# Patient Record
Sex: Female | Born: 1990 | Race: Black or African American | Hispanic: No | Marital: Single | Smoking: Current every day smoker
Health system: Southern US, Community
[De-identification: ages and names within clinical notes are randomized; demographics above are authoritative.]

## PROBLEM LIST (undated history)

## (undated) ENCOUNTER — Inpatient Hospital Stay (HOSPITAL_COMMUNITY): Payer: Self-pay

## (undated) DIAGNOSIS — G43909 Migraine, unspecified, not intractable, without status migrainosus: Secondary | ICD-10-CM

## (undated) DIAGNOSIS — A6000 Herpesviral infection of urogenital system, unspecified: Secondary | ICD-10-CM

## (undated) DIAGNOSIS — Z789 Other specified health status: Secondary | ICD-10-CM

## (undated) DIAGNOSIS — A549 Gonococcal infection, unspecified: Secondary | ICD-10-CM

## (undated) DIAGNOSIS — A749 Chlamydial infection, unspecified: Secondary | ICD-10-CM

## (undated) HISTORY — PX: NO PAST SURGERIES: SHX2092

---
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
## 2011-01-05 LAB — RAPID URINE DRUG SCREEN, HOSP PERFORMED: Tetrahydrocannabinol: NOT DETECTED

## 2011-04-10 NOTE — L&D Delivery Note (Signed)
Delivery Note At 7:00 PM a viable female was delivered via Vaginal, Spontaneous Delivery (Presentation: ;  ).  APGAR: , ; weight .   Placenta status: , .  Cord:  with the following complications: .  Cord pH: not done  Anesthesia: Epidural  Episiotomy:  Lacerations:  Suture Repair: 2.0 vicryl Est. Blood Loss (mL):   Mom to postpartum.  Baby to nursery-stable.  Shady Padron A 03/27/2012, 7:10 PM

## 2011-08-29 ENCOUNTER — Encounter (HOSPITAL_COMMUNITY): Payer: Self-pay

## 2011-08-29 ENCOUNTER — Emergency Department (INDEPENDENT_AMBULATORY_CARE_PROVIDER_SITE_OTHER)
Admission: EM | Admit: 2011-08-29 | Discharge: 2011-08-29 | Disposition: A | Discharge status: Home / Self Care | Payer: Medicaid Other | Source: Home / Self Care | Attending: Emergency Medicine | Admitting: Emergency Medicine

## 2011-08-29 DIAGNOSIS — Z3201 Encounter for pregnancy test, result positive: Secondary | ICD-10-CM

## 2011-08-29 LAB — POCT PREGNANCY, URINE: Preg Test, Ur: POSITIVE — AB

## 2011-08-29 MED ORDER — PRENATAL MULTIVIT-IRON PO TABS: 1.0000 | ORAL_TABLET | ORAL | Status: DC

## 2011-08-29 MED ORDER — PRENATAL VITAMINS (DIS) PO TABS: 1.0000 | ORAL_TABLET | ORAL | Status: DC

## 2011-08-29 MED ORDER — PRENATAL VITAMINS PLUS 27-1 MG PO TABS: 1.0000 | ORAL_TABLET | ORAL | Status: DC

## 2011-08-29 NOTE — ED Notes (Signed)
States she has missed 2 periods, having episodic lower abdominal area pain, none at present; here for pregnancy test only; denies STD concerns

## 2011-08-29 NOTE — Discharge Instructions (Signed)
Prenatal Care  WHAT IS PRENATAL CARE?  Prenatal care means health care during your pregnancy, before your baby is born. Take care of yourself and your baby by:   Getting early prenatal care. If you know you are pregnant, or think you might be pregnant, call your caregiver as soon as possible. Schedule a visit for a general/prenatal examination.   Getting regular prenatal care. Follow your caregiver's schedule for blood and other necessary tests. Do not miss appointments.   Do everything you can to keep yourself and your baby healthy during your pregnancy.   Prenatal care should include evaluation of medical, dietary, educational, psychological, and social needs for the couple and the medical, surgical, and genetic history of the family of the mother and father.   Discuss with your caregiver:   Your medicines, prescription, over-the-counter, and herbal medicines.   Substance abuse, alcohol, smoking, and illegal drugs.   Domestic abuse and violence, if present.   Your immunizations.   Nutrition and diet.   Exercising.   Environment and occupational hazards, at home and at work.   History of sexually transmitted disease, both you and your partner.   Previous pregnancies.  WHY IS PRENATAL CARE SO IMPORTANT?  By seeing you regularly, your caregiver has the chance to find problems early, so that they can be treated as soon as possible. Other problems might be prevented. Many studies have shown that early and regular prenatal care is important for the health of both mothers and their babies.  I AM THINKING ABOUT GETTING PREGNANT. HOW CAN I TAKE CARE OF MYSELF?  Taking care of yourself before you get pregnant helps you to have a healthy pregnancy. It also lowers your chances of having a baby born with a birth defect. Here are ways to take care of yourself before you get pregnant:   Eat healthy foods, exercise regularly (30 minutes per day for most days of the week is best), and get enough  rest and sleep. Talk to your caregiver about what kinds of foods and exercises are best for you.   Take 400 micrograms (mcg) of folic acid (one of the B vitamins) every day. The best way to do this is to take a daily multivitamin pill that contains this amount of folic acid. Getting enough of the synthetic (manufactured) form of folic acid every day before you get pregnant and during early pregnancy can help prevent certain birth defects. Many breakfast cereals and other grain products have folic acid added to them, but only certain cereals contain 400 mcg of folic acid per serving. Check the label on your multivitamin or cereal to find the amount of folic acid in the food.   See your caregiver for a complete check up before getting pregnant. Make sure that you have had all your immunization shots, especially for rubella (German measles). Rubella can cause serious birth defects. Chickenpox is another illness you want to avoid during pregnancy. If you have had chickenpox and rubella in the past, you should be immune to them.   Tell your caregiver about any prescription or non-prescription medicines (including herbal remedies) you are taking. Some medicines are not safe to take during pregnancy.   Stop smoking cigarettes, drinking alcohol, or taking illegal drugs. Ask your caregiver for help, if you need it. You can also get help with alcohol and drugs by talking with a member of your faith community, a counselor, or a trusted friend.   Discuss and treat any medical, social, or psychological   problems before getting pregnant.   Discuss any history of genetic problems in the mother, father, and their families. Do genetic testing before getting pregnant, when possible.   Discuss any physical or emotional abuse with your caregiver.   Discuss with your caregiver if you might be exposed to harmful chemicals on your job or where you live.   Discuss with your caregiver if you think your job or the hours you  work may be harmful and should be changed.   The father should be involved with the decision making and with all aspects of the pregnancy, labor, and delivery.   If you have medical insurance, make sure you are covered for pregnancy.  I JUST FOUND OUT THAT I AM PREGNANT. HOW CAN I TAKE CARE OF MYSELF?  Here are ways to take care of yourself and the precious new life growing inside you:   Continue taking your multivitamin with 400 micrograms (mcg) of folic acid every day.   Get early and regular prenatal care. It does not matter if this is your first pregnancy or if you already have children. It is very important to see a caregiver during your pregnancy. Your caregiver will check at each visit to make sure that you and the baby are healthy. If there are any problems, action can be taken right away to help you and the baby.   Eat a healthy diet that includes:   Fruits.   Vegetables.   Foods low in saturated fat.   Grains.   Calcium-rich foods.   Drink 6 to 8 glasses of liquids a day.   Unless your caregiver tells you not to, try to be physically active for 30 minutes, most days of the week. If you are pressed for time, you can get your activity in through 10 minute segments, three times a day.   If you smoke, drink alcohol, or use drugs, STOP. These can cause long-term damage to your baby. Talk with your caregiver about steps to take to stop smoking. Talk with a member of your faith community, a counselor, a trusted friend, or your caregiver if you are concerned about your alcohol or drug use.   Ask your caregiver before taking any medicine, even over-the-counter medicines. Some medicines are not safe to take during pregnancy.   Get plenty of rest and sleep.   Avoid hot tubs and saunas during pregnancy.   Do not have X-rays taken, unless absolutely necessary and with the recommendation of your caregiver. A lead shield can be placed on your abdomen, to protect the baby when X-rays  are taken in other parts of the body.   Do not empty the cat litter when you are pregnant. It may contain a parasite that causes an infection called toxoplasmosis, which can cause birth defects. Also, use gloves when working in garden areas used by cats.   Do not eat uncooked or undercooked cheese, meats, or fish.   Stay away from toxic chemicals like:   Insecticides.   Solvents (some cleaners or paint thinners).   Lead.   Mercury.   Sexual relations may continue until the end of the pregnancy, unless you have a medical problem or there is a problem with the pregnancy and your caregiver tells you not to.   Do not wear high heel shoes, especially during the second half of the pregnancy. You can lose your balance and fall.   Do not take long trips, unless absolutely necessary. Be sure to see your caregiver before   going on the trip.   Do not sit in one position for more than 2 hours, when on a trip.   Take a copy of your medical records when going on a trip.   Know where there is a hospital in the city you are visiting, in case of an emergency.   Most dangerous household products will have pregnancy warnings on their labels. Ask your caregiver about products if you are unsure.   Limit or eliminate your caffeine intake from coffee, tea, sodas, medicines, and chocolate.   Many women continue working through pregnancy. Staying active might help you stay healthier. If you have a question about the safety or the hours you work at your particular job, talk with your caregiver.   Get informed:   Read books.   Watch videos.   Go to childbirth classes for you and the father.   Talk with experienced moms.   Ask your caregiver about childbirth education classes for you and your partner. Classes can help you and your partner prepare for the birth of your baby.   Ask about a pediatrician (baby doctor) and methods and pain medicine for labor, delivery, and possible Cesarean delivery  (C-section).  I AM NOT THINKING ABOUT GETTING PREGNANT RIGHT NOW, BUT HEARD THAT ALL WOMEN SHOULD TAKE FOLIC ACID EVERY DAY?  All women of childbearing age, with even a remote chance of getting pregnant, should try to make sure they get enough folic acid. Many pregnancies are not planned. Many women do not know they are actually pregnant early in their pregnancies, and certain birth defects happen in the very early part of pregnancy. Taking 400 micrograms (mcg) of folic acid every day will help prevent certain birth defects that happen in the early part of pregnancy. If a woman begins taking vitamin pills in the second or third month of pregnancy, it may be too late to prevent birth defects. Folic acid may also have other health benefits for women, besides preventing birth defects.  HOW OFTEN SHOULD I SEE MY CAREGIVER DURING PREGNANCY?  Your caregiver will give you a schedule for your prenatal visits. You will have visits more often as you get closer to the end of your pregnancy. An average pregnancy lasts about 40 weeks.  A typical schedule includes visiting your caregiver:   About once each month, during your first 6 months of pregnancy.   Every 2 weeks, during the next 2 months.   Weekly in the last month, until the delivery date.  Your caregiver will probably want to see you more often if:  You are over 35.   Your pregnancy is high risk, because you have certain health problems or problems with the pregnancy, such as:   Diabetes.   High blood pressure.   The baby is not growing on schedule, according to the dates of the pregnancy.  Your caregiver will do special tests, to make sure you and the baby are not having any serious problems. WHAT HAPPENS DURING PRENATAL VISITS?   At your first prenatal visit, your caregiver will talk to you about you and your partner's health history and your family's health history, and will do a physical exam.   On your first visit, a physical exam will  include checks of your blood pressure, height and weight, and an exam of your pelvic organs. Your caregiver will do a Pap test if you have not had one recently, and will do cultures of your cervix to make sure there is no   infection.   At each visit, there will be tests of your blood, urine, blood pressure, weight, and checking the progress of the baby.   Your caregiver will be able to tell you when to expect that your baby will be born.   Each visit is also a chance for you to learn about staying healthy during pregnancy and for asking questions.   Discuss whether you will be breastfeeding.   At your later prenatal visits, your caregiver will check how you are doing and how the baby is developing. You may have a number of tests done as your pregnancy progresses.   Ultrasound exams are often used to check on the baby's growth and health.   You may have more urine and blood tests, as well as special tests, if needed. These may include amniocentesis (examine fluid in the pregnancy sac), stress tests (check how baby responds to contractions), biophysical profile (measures fetus well-being). Your caregiver will explain the tests and why they are necessary.  I AM IN MY LATE THIRTIES, AND I WANT TO HAVE A CHILD NOW. SHOULD I DO ANYTHING SPECIAL?  As you get older, there is more chance of having a medical problem (high blood pressure), pregnancy problem (preeclampsia, problems with the placenta), miscarriage, or a baby born with a birth defect. However, most women in their late thirties and early forties have healthy babies. See your caregiver on a regular basis before you get pregnant and be sure to go for exams throughout your pregnancy. Your caregiver probably will want to do some special tests to check on you and your baby's health when you are pregnant.  Women today are often delaying having children until later in life, when they are in their thirties and forties. While many women in their thirties  and forties have no difficulty getting pregnant, fertility does decline with age. For women over 40 who cannot get pregnant after 6 months of trying, it is recommended that they see their caregiver for a fertility evaluation. It is not uncommon to have trouble becoming pregnant or experience infertility (inability to become pregnant after trying for one year). If you think that you or your partner may be infertile, you can discuss this with your caregiver. He or she can recommend treatments such as drugs, surgery, or assisted reproductive technology.  Document Released: 03/29/2003 Document Revised: 03/15/2011 Document Reviewed: 02/23/2009 [facility] Patient Information 2012 [place], [place].Pregnancy Tests HOW DO PREGNANCY TESTS WORK? All pregnancy tests look for a special hormone in the urine or blood that is only present in pregnant women. This hormone, human chorionic gonadotropin (hCG), is also called the pregnancy hormone.  WHAT IS THE DIFFERENCE BETWEEN A URINE AND A BLOOD PREGNANCY TEST? IS ONE BETTER THAN THE OTHER? There are two types of pregnancy tests.  Blood tests.   Urine tests.  Both tests look for the presence of hCG, the pregnancy hormone. Many women use a urine test or home pregnancy test (HPT) to find out if they are pregnant. HPTs are cheap, easy to use, can be done at home, and are private. When a woman has a positive result on an HPT, she needs to see her caregiver right away. The caregiver can confirm a positive HPT result with another urine test, a blood test, ultrasound, and a pelvic exam.  There are two types of blood tests you can get from a caregiver.   A quantitative blood test (or the beta hCG test). This test measures the exact amount of hCG in  the blood. This means it can pick up very small amounts of hCG, making it a very accurate test.   A qualitative hCG blood test. This test gives a simple yes or no answer to whether you are pregnant. This test is more like a urine  test in terms of its accuracy.  Blood tests can pick up hCG earlier in a pregnancy than urine tests can. Blood tests can tell if you are pregnant about 6 to 8 days after you release an egg from an ovary (ovulate). Urine tests can determine pregnancy about 2 weeks after ovulation. Some more sensitive urine tests can tell if you are pregnant as early as 6 days or even 1 day after you miss a menstrual period.  HOW IS A HOME PREGNANCY TEST DONE?  There are many types of home pregnancy tests or HPTs that can be bought over-the-counter at drug or discount stores.   Some involve collecting your urine in a cup and dipping a stick into the urine or putting some of the urine into a special container with an eyedropper.   Others are done by placing a stick into your urine stream.   Tests vary in how long you need to wait for the stick or container to turn a certain color or have a symbol on it (like a plus or a minus).   All tests come with written instructions. Most tests also have toll-free phone numbers to call if you have any questions about how to do the test or read the results.  HOW ACCURATE ARE HOME PREGNANCY TESTS?  HPTs are very accurate. Most brands of HPTs say they are 97% to 99% accurate when taken 1 week after missing your menstrual period, but this can vary with actual use. Each brand varies in how sensitive it is in picking up the pregnancy hormone hCG. If a test is not done correctly, it will be less accurate. Always check the package to make sure it is not past its expiration date. If it is, it will not be accurate. Most brands of HPTs tell users to do the test again in a few days, no matter what the results.  If you use an HPT too early in your pregnancy, you may not have enough of the pregnancy hormone hCG in your urine to have a positive test result. Most HPTs will be accurate if you test yourself around the time your period is due (about 2 weeks after you ovulate). You can get a negative  test result if you are not pregnant or if you ovulated later than you thought you did. You may also have problems with the pregnancy, which affects the amount of hCG you have in your urine. If your HPT is negative, test yourself again within a few days to 1 week. If you keep getting a negative result and think you are pregnant, talk with your caregiver right away about getting a blood pregnancy test.  FALSE POSITIVE PREGNANCY TEST A false positive HPT can happen if there is blood or protein present in your urine. A false positive can also happen if you were recently pregnant or if you take a pregnancy test too soon after taking fertility drug that contains hCG. Also, some prescription medicines such as water pills (diuretics), tranquilizers, seizure medicines, psychiatric medicines, and allergy and nausea medicines (promethazine) give false positive readings. FALSE NEGATIVE PREGNANCY TEST  A false negative HPT can happen if you do the test too early. Try to wait until you  are at least 1 day late for your menstrual period.   It may happen if you wait too long to test the urine (longer than 15 minutes).   It may also happen if the urine is too diluted because you drank a lot of fluids before getting the urine sample. It is best to test the first morning urine after you get out of bed.  If your menstrual period did not start after a week of a negative HPT, repeat the pregnancy test. CAN ANYTHING INTERFERE WITH HOME PREGNANCY TEST RESULTS?  Most medicines, both over-the-counter and prescription drugs, including birth control pills and antibiotics, should not affect the results of a HPT. Only those drugs that have the pregnancy hormone hCG in them can give a false positive test result. Drugs that have hCG in them may be used for treating infertility (not being able to get pregnant). Alcohol and illegal drugs do not affect HPT results, but you should not be using these substances if you are trying to get  pregnant. If you have a positive pregnancy test, call your caregiver to make an appointment to begin prenatal care. Document Released: 03/29/2003 Document Revised: 03/15/2011 Document Reviewed: 06/09/2010 [facility] Patient Information 2012 [place], [place].

## 2011-08-29 NOTE — ED Provider Notes (Addendum)
History     CSN: [id number]  Arrival date & time 08/29/11  1057   First MD Initiated Contact with Patient 08/29/11 1128      Chief Complaint  Patient presents with  . Possible Pregnancy    (Consider location/radiation/quality/duration/timing/severity/associated sxs/prior treatment) HPI Comments: Have missed my period for the last 2 months and is wondering if I'm pregnant? Been feeling nauseous in the mornings and urinating more. Yes this is my first pregnancy. No I have no doctor. Patient denies any pelvic pain, or vaginal bleeding .  The history is provided by the patient.    History reviewed. No pertinent past medical history.  History reviewed. No pertinent past surgical history.  History reviewed. No pertinent family history.  History  Substance Use Topics  . Smoking status: Current Everyday Smoker  . Smokeless tobacco: Not on file  . Alcohol Use: No    OB History    Grav Para Term Preterm Abortions TAB SAB Ect Mult Living                  Review of Systems  Constitutional: Positive for fatigue. Negative for fever, chills, diaphoresis, activity change and unexpected weight change.  Gastrointestinal: Positive for nausea. Negative for vomiting, abdominal pain, abdominal distention and rectal pain.  Genitourinary: Positive for menstrual problem. Negative for dysuria, flank pain, vaginal bleeding, vaginal discharge and vaginal pain.    Allergies  Review of patient's allergies indicates no known allergies.  Home Medications   Current Outpatient Rx  Name Route Sig Dispense Refill  . PRENATAL VITAMINS PLUS 27-1 MG PO TABS Oral Take 1 tablet by mouth 1 day or 1 dose. 30 tablet 3  . PRENATAL VITAMINS (DIS) PO TABS Oral Take 1 tablet by mouth 1 day or 1 dose. 30 tablet 3    BP 113/69  Pulse 90  Temp(Src) 97.8 F (36.6 C) (Oral)  Resp 14  SpO2 98%  LMP 06/26/2011  Physical Exam  Nursing note and vitals reviewed. Constitutional: She appears well-developed and  well-nourished.  HENT:  Head: Normocephalic.  Eyes: Conjunctivae are normal.  Neck: Neck supple.  Pulmonary/Chest: No respiratory distress.  Abdominal: Soft. She exhibits no distension. There is no tenderness.  Genitourinary: No vaginal discharge found.  Musculoskeletal: Normal range of motion.  Neurological: She is alert.  Skin: No erythema.    ED Course  Procedures (including critical care time)  Labs Reviewed  POCT PREGNANCY, URINE - Abnormal; Notable for the following:    Preg Test, Ur POSITIVE (*)    All other components within normal limits   No results found.   1. Pregnancy test positive       MDM  First trimester, uncomplicated. [facility] and prenatal vitamins prescribed        Jimmie Molly, MD 08/29/11 1317  Jimmie Molly, MD 08/29/11 1318

## 2011-08-30 ENCOUNTER — Encounter (HOSPITAL_COMMUNITY): Payer: Self-pay | Admitting: *Deleted

## 2011-08-30 ENCOUNTER — Inpatient Hospital Stay (HOSPITAL_COMMUNITY)
Admission: AD | Admit: 2011-08-30 | Discharge: 2011-08-30 | Disposition: A | Discharge status: Home / Self Care | Payer: Medicaid Other | Source: Ambulatory Visit | Attending: Obstetrics & Gynecology | Admitting: Obstetrics & Gynecology

## 2011-08-30 DIAGNOSIS — B9689 Other specified bacterial agents as the cause of diseases classified elsewhere: Secondary | ICD-10-CM | POA: Insufficient documentation

## 2011-08-30 DIAGNOSIS — B3731 Acute candidiasis of vulva and vagina: Secondary | ICD-10-CM | POA: Insufficient documentation

## 2011-08-30 DIAGNOSIS — A499 Bacterial infection, unspecified: Secondary | ICD-10-CM | POA: Insufficient documentation

## 2011-08-30 DIAGNOSIS — Z3490 Encounter for supervision of normal pregnancy, unspecified, unspecified trimester: Secondary | ICD-10-CM

## 2011-08-30 DIAGNOSIS — B373 Candidiasis of vulva and vagina: Secondary | ICD-10-CM | POA: Insufficient documentation

## 2011-08-30 DIAGNOSIS — N760 Acute vaginitis: Secondary | ICD-10-CM

## 2011-08-30 DIAGNOSIS — R109 Unspecified abdominal pain: Secondary | ICD-10-CM | POA: Insufficient documentation

## 2011-08-30 DIAGNOSIS — O2390 Unspecified genitourinary tract infection in pregnancy, unspecified trimester: Secondary | ICD-10-CM | POA: Insufficient documentation

## 2011-08-30 LAB — URINALYSIS, ROUTINE W REFLEX MICROSCOPIC
Ketones, ur: NEGATIVE mg/dL
Nitrite: NEGATIVE
Specific Gravity, Urine: 1.02 (ref 1.005–1.030)
Urobilinogen, UA: 0.2 mg/dL (ref 0.0–1.0)
pH: 7 (ref 5.0–8.0)

## 2011-08-30 LAB — URINE MICROSCOPIC-ADD ON

## 2011-08-30 LAB — WET PREP, GENITAL: Trich, Wet Prep: NONE SEEN

## 2011-08-30 MED ORDER — METRONIDAZOLE 500 MG PO TABS: 500.0000 mg | ORAL_TABLET | Freq: Two times a day (BID) | ORAL | Status: AC

## 2011-08-30 MED ORDER — TERCONAZOLE 0.4 % VA CREA: 1.0000 | TOPICAL_CREAM | Freq: Every day | VAGINAL | Status: AC

## 2011-08-30 NOTE — MAU Provider Note (Signed)
History     CSN: [id number]  Arrival date and time: 08/30/11 1146   First Provider Initiated Contact with Patient 08/30/11 1441      Chief Complaint  Patient presents with  . Abdominal Pain   HPI This is a 21 y.o. female at [redacted]w[redacted]d who presents with c/o cramping for a month. Wants a pregnancy verification letter with [facility] on it. No bleeding. No fever. Has had no PNC but plans to go to [place].  Trying for a year to get pregnant  Missed period 2 months, has been cramping. Was at urgent care yesterday. Test was positive. Wants confirmation. Explained we use the exact tests they do, if they said positive it is positive. No cultures or further testing was done      OB History    Grav Para Term Preterm Abortions TAB SAB Ect Mult Living   1         0      History reviewed. No pertinent past medical history.  History reviewed. No pertinent past surgical history.  Family History  Problem Relation Age of Onset  . Hypertension Mother     History  Substance Use Topics  . Smoking status: Current Everyday Smoker -- 0.2 packs/day  . Smokeless tobacco: Never Used  . Alcohol Use: No    Allergies: No Known Allergies  Prescriptions prior to admission  Medication Sig Dispense Refill  . Prenatal Vit-Fe Sulfate-FA (PRENATAL MULTIVIT-IRON) TABS Take 1 tablet by mouth 1 day or 1 dose.  30 tablet  3    ROS As listed in HPI  Physical Exam   Blood pressure 124/55, pulse 89, temperature 97.9 F (36.6 C), temperature source Oral, resp. rate 18, height 5\' 3"  (1.6 m), weight 130 lb (58.968 kg), last menstrual period 06/21/2011.  Physical Exam  Constitutional: She is oriented to person, place, and time. She appears well-developed and well-nourished. No distress.  HENT:  Head: Normocephalic.  Cardiovascular: Normal rate.   Respiratory: Effort normal.  GI: Soft. She exhibits no distension. There is no tenderness. There is no rebound and no guarding.  Genitourinary: Uterus normal. Vaginal  discharge found.       + FHTs by RN Uterus nontender Adnexae nontender  Musculoskeletal: Normal range of motion.  Neurological: She is alert and oriented to person, place, and time.  Skin: Skin is warm and dry.  Psychiatric: She has a normal mood and affect.   Results for orders placed during the hospital encounter of 08/30/11 (from the past 24 hour(s))  URINALYSIS, ROUTINE W REFLEX MICROSCOPIC     Status: Abnormal   Collection Time   08/30/11 12:36 PM      Component Value Range   Color, Urine YELLOW  YELLOW    APPearance CLEAR  CLEAR    Specific Gravity, Urine 1.020  1.005 - 1.030    pH 7.0  5.0 - 8.0    Glucose, UA NEGATIVE  NEGATIVE (mg/dL)   Hgb urine dipstick NEGATIVE  NEGATIVE    Bilirubin Urine NEGATIVE  NEGATIVE    Ketones, ur NEGATIVE  NEGATIVE (mg/dL)   Protein, ur NEGATIVE  NEGATIVE (mg/dL)   Urobilinogen, UA 0.2  0.0 - 1.0 (mg/dL)   Nitrite NEGATIVE  NEGATIVE    Leukocytes, UA MODERATE (*) NEGATIVE   URINE MICROSCOPIC-ADD ON     Status: Abnormal   Collection Time   08/30/11 12:36 PM      Component Value Range   Squamous Epithelial / LPF FEW (*) RARE  WBC, UA 11-20  <3 (WBC/hpf)   Bacteria, UA RARE  RARE   HCG, QUANTITATIVE, PREGNANCY     Status: Abnormal   Collection Time   08/30/11  1:39 PM      Component Value Range   hCG, Beta Chain, [place] Longest [id number] (*) <5 (mIU/mL)  WET PREP, GENITAL     Status: Abnormal   Collection Time   08/30/11  2:45 PM      Component Value Range   Yeast Wet Prep HPF POC MANY (*) NONE SEEN    Trich, Wet Prep NONE SEEN  NONE SEEN    Clue Cells Wet Prep HPF POC MODERATE (*) NONE SEEN    WBC, Wet Prep HPF POC FEW (*) NONE SEEN     MAU Course  Procedures  MDM Preg Verification letter given  Assessment and Plan  A:  Pregnancy at [redacted]w[redacted]d       Cramping with + FHT so no ectopic       Yeast vaginitis        BV P:  Discharge       Rx Terazol 7        Rx Metronidazole       Followup with [facility] 08/30/2011, 3:23 PM

## 2011-08-30 NOTE — MAU Provider Note (Signed)
Medical Screening exam and patient care preformed by advanced practice provider.  Agree with the above management.  

## 2011-08-30 NOTE — Discharge Instructions (Signed)
ABCs of Pregnancy A Antepartum care is very important. Be sure you see your doctor and get prenatal care as soon as you think you are pregnant. At this time, you will be tested for infection, genetic abnormalities and potential problems with you and the pregnancy. This is the time to discuss diet, exercise, work, medications, labor, pain medication during labor and the possibility of a cesarean delivery. Ask any questions that may concern you. It is important to see your doctor regularly throughout your pregnancy. Avoid exposure to toxic substances and chemicals - such as cleaning solvents, lead and mercury, some insecticides, and paint. Pregnant women should avoid exposure to paint fumes, and fumes that cause you to feel ill, dizzy or faint. When possible, it is a good idea to have a pre-pregnancy consultation with your caregiver to begin some important recommendations your caregiver suggests such as, taking folic acid, exercising, quitting smoking, avoiding alcoholic beverages, etc. B Breastfeeding is the healthiest choice for both you and your baby. It has many nutritional benefits for the baby and health benefits for the mother. It also creates a very tight and loving bond between the baby and mother. Talk to your doctor, your family and friends, and your employer about how you choose to feed your baby and how they can support you in your decision. Not all birth defects can be prevented, but a woman can take actions that may increase her chance of having a healthy baby. Many birth defects happen very early in pregnancy, sometimes before a woman even knows she is pregnant. Birth defects or abnormalities of any child in your or the father's family should be discussed with your caregiver. Get a good support bra as your breast size changes. Wear it especially when you exercise and when nursing.  C Celebrate the news of your pregnancy with the your spouse/father and family. Childbirth classes are helpful to  take for you and the spouse/father because it helps to understand what happens during the pregnancy, labor and delivery. Cesarean delivery should be discussed with your doctor so you are prepared for that possibility. The pros and cons of circumcision if it is a boy, should be discussed with your pediatrician. Cigarette smoking during pregnancy can result in low birth weight babies. It has been associated with infertility, miscarriages, tubal pregnancies, infant death (mortality) and poor health (morbidity) in childhood. Additionally, cigarette smoking may cause long-term learning disabilities. If you smoke, you should try to quit before getting pregnant and not smoke during the pregnancy. Secondary smoke may also harm a mother and her developing baby. It is a good idea to ask people to stop smoking around you during your pregnancy and after the baby is born. Extra calcium is necessary when you are pregnant and is found in your prenatal vitamin, in dairy products, green leafy vegetables and in calcium supplements. D A healthy diet according to your current weight and height, along with vitamins and mineral supplements should be discussed with your caregiver. Domestic abuse or violence should be made known to your doctor right away to get the situation corrected. Drink more water when you exercise to keep hydrated. Discomfort of your back and legs usually develops and progresses from the middle of the second trimester through to delivery of the baby. This is because of the enlarging baby and uterus, which may also affect your balance. Do not take illegal drugs. Illegal drugs can seriously harm the baby and you. Drink extra fluids (water is best) throughout pregnancy to help  your body keep up with the increases in your blood volume. Drink at least 6 to 8 glasses of water, fruit juice, or milk each day. A good way to know you are drinking enough fluid is when your urine looks almost like clear water or is very light  yellow.  E Eat healthy to get the nutrients you and your unborn baby need. Your meals should include the five basic food groups. Exercise (30 minutes of light to moderate exercise a day) is important and encouraged during pregnancy, if there are no medical problems or problems with the pregnancy. Exercise that causes discomfort or dizziness should be stopped and reported to your caregiver. Emotions during pregnancy can change from being ecstatic to depression and should be understood by you, your partner and your family. F Fetal screening with ultrasound, amniocentesis and monitoring during pregnancy and labor is common and sometimes necessary. Take 400 micrograms of folic acid daily both before, when possible, and during the first few months of pregnancy to reduce the risk of birth defects of the brain and spine. All women who could possibly become pregnant should take a vitamin with folic acid, every day. It is also important to eat a healthy diet with fortified foods (enriched grain products, including cereals, rice, breads, and pastas) and foods with natural sources of folate (orange juice, green leafy vegetables, beans, peanuts, broccoli, asparagus, peas, and lentils). The father should be involved with all aspects of the pregnancy including, the prenatal care, childbirth classes, labor, delivery, and postpartum time. Fathers may also have emotional concerns about being a father, financial needs, and raising a family. G Genetic testing should be done appropriately. It is important to know your family and the father's history. If there have been problems with pregnancies or birth defects in your family, report these to your doctor. Also, genetic counselors can talk with you about the information you might need in making decisions about having a family. You can call a major medical center in your area for help in finding a board-certified genetic counselor. Genetic testing and counseling should be done  before pregnancy when possible, especially if there is a history of problems in the mother's or father's family. Certain ethnic backgrounds are more at risk for genetic defects. H Get familiar with the hospital where you will be having your baby. Get to know how long it takes to get there, the labor and delivery area, and the hospital procedures. Be sure your medical insurance is accepted there. Get your home ready for the baby including, clothes, the baby's room (when possible), furniture and car seat. Hand washing is important throughout the day, especially after handling raw meat and poultry, changing the baby's diaper or using the bathroom. This can help prevent the spread of many bacteria and viruses that cause infection. Your hair may become dry and thinner, but will return to normal a few weeks after the baby is born. Heartburn is a common problem that can be treated by taking antacids recommended by your caregiver, eating smaller meals 5 or 6 times a day, not drinking liquids when eating, drinking between meals and raising the head of your bed 2 to 3 inches. I Insurance to cover you, the baby, doctor and hospital should be reviewed so that you will be prepared to pay any costs not covered by your insurance plan. If you do not have medical insurance, there are usually clinics and services available for you in your community. Take 30 milligrams of iron during  your pregnancy as prescribed by your doctor to reduce the risk of low red blood cells (anemia) later in pregnancy. All women of childbearing age should eat a diet rich in iron. J There should be a joint effort for the mother, father and any other children to adapt to the pregnancy financially, emotionally, and psychologically during the pregnancy. Join a support group for moms-to-be. Or, join a class on parenting or childbirth. Have the family participate when possible. K Know your limits. Let your caregiver know if you experience any of the  following:   Pain of any kind.   Strong cramps.   You develop a lot of weight in a short period of time (5 pounds in 3 to 5 days).   Vaginal bleeding, leaking of amniotic fluid.   Headache, vision problems.   Dizziness, fainting, shortness of breath.   Chest pain.   Fever of 102 F (38.9 C) or higher.   Gush of clear fluid from your vagina.   Painful urination.   Domestic violence.   Irregular heartbeat (palpitations).   Rapid beating of the heart (tachycardia).   Constant feeling sick to your stomach (nauseous) and vomiting.   Trouble walking, fluid retention (edema).   Muscle weakness.   If your baby has decreased activity.   Persistent diarrhea.   Abnormal vaginal discharge.   Uterine contractions at 20-minute intervals.   Back pain that travels down your leg.  L Learn and practice that what you eat and drink should be in moderation and healthy for you and your baby. Legal drugs such as alcohol and caffeine are important issues for pregnant women. There is no safe amount of alcohol a woman can drink while pregnant. Fetal alcohol syndrome, a disorder characterized by growth retardation, facial abnormalities, and central nervous system dysfunction, is caused by a woman's use of alcohol during pregnancy. Caffeine, found in tea, coffee, soft drinks and chocolate, should also be limited. Be sure to read labels when trying to cut down on caffeine during pregnancy. More than 200 foods, beverages, and over-the-counter medications contain caffeine and have a high salt content! There are coffees and teas that do not contain caffeine. M Medical conditions such as diabetes, epilepsy, and high blood pressure should be treated and kept under control before pregnancy when possible, but especially during pregnancy. Ask your caregiver about any medications that may need to be changed or adjusted during pregnancy. If you are currently taking any medications, ask your caregiver if it  is safe to take them while you are pregnant or before getting pregnant when possible. Also, be sure to discuss any herbs or vitamins you are taking. They are medicines, too! Discuss with your doctor all medications, prescribed and over-the-counter, that you are taking. During your prenatal visit, discuss the medications your doctor may give you during labor and delivery. N Never be afraid to ask your doctor or caregiver questions about your health, the progress of the pregnancy, family problems, stressful situations, and recommendation for a pediatrician, if you do not have one. It is better to take all precautions and discuss any questions or concerns you may have during your office visits. It is a good idea to write down your questions before you visit the doctor. O Over-the-counter cough and cold remedies may contain alcohol or other ingredients that should be avoided during pregnancy. Ask your caregiver about prescription, herbs or over-the-counter medications that you are taking or may consider taking while pregnant.  P Physical activity during pregnancy can  benefit both you and your baby by lessening discomfort and fatigue, providing a sense of well-being, and increasing the likelihood of early recovery after delivery. Light to moderate exercise during pregnancy strengthens the belly (abdominal) and back muscles. This helps improve posture. Practicing yoga, walking, swimming, and cycling on a stationary bicycle are usually safe exercises for pregnant women. Avoid scuba diving, exercise at high altitudes (over 3000 feet), skiing, horseback riding, contact sports, etc. Always check with your doctor before beginning any kind of exercise, especially during pregnancy and especially if you did not exercise before getting pregnant. Q Queasiness, stomach upset and morning sickness are common during pregnancy. Eating a couple of crackers or dry toast before getting out of bed. Foods that you normally love may  make you feel sick to your stomach. You may need to substitute other nutritious foods. Eating 5 or 6 small meals a day instead of 3 large ones may make you feel better. Do not drink with your meals, drink between meals. Questions that you have should be written down and asked during your prenatal visits. R Read about and make plans to baby-proof your home. There are important tips for making your home a safer environment for your baby. Review the tips and make your home safer for you and your baby. Read food labels regarding calories, salt and fat content in the food. S Saunas, hot tubs, and steam rooms should be avoided while you are pregnant. Excessive high heat may be harmful during your pregnancy. Your caregiver will screen and examine you for sexually transmitted diseases and genetic disorders during your prenatal visits. Learn the signs of labor. Sexual relations while pregnant is safe unless there is a medical or pregnancy problem and your caregiver advises against it. T Traveling long distances should be avoided especially in the third trimester of your pregnancy. If you do have to travel out of state, be sure to take a copy of your medical records and medical insurance plan with you. You should not travel long distances without seeing your doctor first. Most airlines will not allow you to travel after 36 weeks of pregnancy. Toxoplasmosis is an infection caused by a parasite that can seriously harm an unborn baby. Avoid eating undercooked meat and handling cat litter. Be sure to wear gloves when gardening. Tingling of the hands and fingers is not unusual and is due to fluid retention. This will go away after the baby is born. U Womb (uterus) size increases during the first trimester. Your kidneys will begin to function more efficiently. This may cause you to feel the need to urinate more often. You may also leak urine when sneezing, coughing or laughing. This is due to the growing uterus pressing  against your bladder, which lies directly in front of and slightly under the uterus during the first few months of pregnancy. If you experience burning along with frequency of urination or bloody urine, be sure to tell your doctor. The size of your uterus in the third trimester may cause a problem with your balance. It is advisable to maintain good posture and avoid wearing high heels during this time. An ultrasound of your baby may be necessary during your pregnancy and is safe for you and your baby. V Vaccinations are an important concern for pregnant women. Get needed vaccines before pregnancy. Center for Disease Control (http://www.wolf.info/) has clear guidelines for the use of vaccines during pregnancy. Review the list, be sure to discuss it with your doctor. Prenatal vitamins are helpful  and healthy for you and the baby. Do not take extra vitamins except what is recommended. Taking too much of certain vitamins can cause overdose problems. Continuous vomiting should be reported to your caregiver. Varicose veins may appear especially if there is a family history of varicose veins. They should subside after the delivery of the baby. Support hose helps if there is leg discomfort. W Being overweight or underweight during pregnancy may cause problems. Try to get within 15 pounds of your ideal weight before pregnancy. Remember, pregnancy is not a time to be dieting! Do not stop eating or start skipping meals as your weight increases. Both you and your baby need the calories and nutrition you receive from a healthy diet. Be sure to consult with your doctor about your diet. There is a formula and diet plan available depending on whether you are overweight or underweight. Your caregiver or nutritionist can help and advise you if necessary. X Avoid X-rays. If you must have dental work or diagnostic tests, tell your dentist or physician that you are pregnant so that extra care can be taken. X-rays should only be taken when  the risks of not taking them outweigh the risk of taking them. If needed, only the minimum amount of radiation should be used. When X-rays are necessary, protective lead shields should be used to cover areas of the body that are not being X-rayed. Y Your baby loves you. Breastfeeding your baby creates a loving and very close bond between the two of you. Give your baby a healthy environment to live in while you are pregnant. Infants and children require constant care and guidance. Their health and safety should be carefully watched at all times. After the baby is born, rest or take a nap when the baby is sleeping. Z Get your ZZZs. Be sure to get plenty of rest. Resting on your side as often as possible, especially on your left side is advised. It provides the best circulation to your baby and helps reduce swelling. Try taking a nap for 30 to 45 minutes in the afternoon when possible. After the baby is born rest or take a nap when the baby is sleeping. Try elevating your feet for that amount of time when possible. It helps the circulation in your legs and helps reduce swelling.  Most information courtesy of the CDC. Document Released: 03/26/2005 Document Revised: 03/15/2011 Document Reviewed: 12/08/2008 [employer] Patient Information 2012 [employer]. Bacterial Vaginosis Bacterial vaginosis (BV) is a vaginal infection where the normal balance of bacteria in the vagina is disrupted. The normal balance is then replaced by an overgrowth of certain bacteria. There are several different kinds of bacteria that can cause BV. BV is the most common vaginal infection in women of childbearing age. CAUSES   The cause of BV is not fully understood. BV develops when there is an increase or imbalance of harmful bacteria.   Some activities or behaviors can upset the normal balance of bacteria in the vagina and put women at increased risk including:   Having a new sex partner or multiple sex partners.   Douching.     Using an intrauterine device (IUD) for contraception.   It is not clear what role sexual activity plays in the development of BV. However, women that have never had sexual intercourse are rarely infected with BV.  Women do not get BV from toilet seats, bedding, swimming pools or from touching objects around them.  SYMPTOMS   Grey vaginal discharge.     odor with discharge, especially after sexual intercourse.   Itching or burning of the vagina and vulva.   Burning or pain with urination.   Some women have no signs or symptoms at all.  DIAGNOSIS  Your caregiver must examine the vagina for signs of BV. Your caregiver will perform lab tests and look at the sample of vaginal fluid through a microscope. They will look for bacteria and abnormal cells (clue cells), a pH test higher than 4.5, and a positive amine test all associated with BV.  RISKS AND COMPLICATIONS   Pelvic inflammatory disease (PID).   Infections following gynecology surgery.   Developing HIV.   Developing herpes virus.  TREATMENT  Sometimes BV will clear up without treatment. However, all women with symptoms of BV should be treated to avoid complications, especially if gynecology surgery is planned. Female partners generally do not need to be treated. However, BV may spread between female sex partners so treatment is helpful in preventing a recurrence of BV.   BV may be treated with antibiotics. The antibiotics come in either pill or vaginal cream forms. Either can be used with nonpregnant or pregnant women, but the recommended dosages differ. These antibiotics are not harmful to the baby.   BV can recur after treatment. If this happens, a second round of antibiotics will often be prescribed.   Treatment is important for pregnant women. If not treated, BV can cause a premature delivery, especially for a pregnant woman who had a premature birth in the past. All pregnant women who have symptoms of BV should be  checked and treated.   For chronic reoccurrence of BV, treatment with a type of prescribed gel vaginally twice a week is helpful.  HOME CARE INSTRUCTIONS   Finish all medication as directed by your caregiver.   Do not have sex until treatment is completed.   Tell your sexual partner that you have a vaginal infection. They should see their caregiver and be treated if they have problems, such as a mild rash or itching.   Practice safe sex. Use condoms. Only have 1 sex partner.  PREVENTION  Basic prevention steps can help reduce the risk of upsetting the natural balance of bacteria in the vagina and developing BV:  Do not have sexual intercourse (be abstinent).   Do not douche.   Use all of the medicine prescribed for treatment of BV, even if the signs and symptoms go away.   Tell your sex partner if you have BV. That way, they can be treated, if needed, to prevent reoccurrence.  SEEK MEDICAL CARE IF:   Your symptoms are not improving after 3 days of treatment.   You have increased discharge, pain, or fever.  MAKE SURE YOU:   Understand these instructions.   Will watch your condition.   Will get help right away if you are not doing well or get worse.  FOR MORE INFORMATION  Division of STD Prevention (DSTDP), Centers for Disease Control and Prevention: [employer]Apps[employer].za American Social Health Association (ASHA): www.ashastd.org  Document Released: 03/26/2005 Document Revised: 03/15/2011 Document Reviewed: 09/16/2008 [facility] Patient Information 2012 [place], [place].  Monilial Vaginitis Vaginitis in a soreness, swelling and redness (inflammation) of the vagina and vulva. Monilial vaginitis is not a sexually transmitted infection. CAUSES  Yeast vaginitis is caused by yeast (candida) that is normally found in your vagina. With a yeast infection, the candida has overgrown in number to a point that upsets the chemical balance. SYMPTOMS   White, thick vaginal discharge.  Swelling, itching, redness and irritation of the vagina and possibly the lips of the vagina (vulva).   Burning or painful urination.   Painful intercourse.  DIAGNOSIS  Things that may contribute to monilial vaginitis are:  Postmenopausal and virginal states.   Pregnancy.   Infections.   Being tired, sick or stressed, especially if you had monilial vaginitis in the past.   Diabetes. Good control will help lower the chance.   Birth control pills.   Tight fitting garments.   Using bubble bath, feminine sprays, douches or deodorant tampons.   Taking certain medications that kill germs (antibiotics).   Sporadic recurrence can occur if you become ill.  TREATMENT  Your caregiver will give you medication.  There are several kinds of anti monilial vaginal creams and suppositories specific for monilial vaginitis. For recurrent yeast infections, use a suppository or cream in the vagina 2 times a week, or as directed.   Anti-monilial or steroid cream for the itching or irritation of the vulva may also be used. Get your caregiver's permission.   Painting the vagina with methylene blue solution may help if the monilial cream does not work.   Eating yogurt may help prevent monilial vaginitis.  HOME CARE INSTRUCTIONS   Finish all medication as prescribed.   Do not have sex until treatment is completed or after your caregiver tells you it is okay.   Take warm sitz baths.   Do not douche.   Do not use tampons, especially scented ones.   Wear cotton underwear.   Avoid tight pants and panty hose.   Tell your sexual partner that you have a yeast infection. They should go to their caregiver if they have symptoms such as mild rash or itching.   Your sexual partner should be treated as well if your infection is difficult to eliminate.   Practice safer sex. Use condoms.   Some vaginal medications cause latex condoms to fail. Vaginal medications that harm condoms are:   Cleocin  cream.   Butoconazole (Femstat).   Terconazole (Terazol) vaginal suppository.   Miconazole (Monistat) (may be purchased over the counter).  SEEK MEDICAL CARE IF:   You have a temperature by mouth above 102 F (38.9 C).   The infection is getting worse after 2 days of treatment.   The infection is not getting better after 3 days of treatment.   You develop blisters in or around your vagina.   You develop vaginal bleeding, and it is not your menstrual period.   You have pain when you urinate.   You develop intestinal problems.   You have pain with sexual intercourse.  Document Released: 01/03/2005 Document Revised: 03/15/2011 Document Reviewed: 09/17/2008 [facility] Patient Information 2012 [place], [place].

## 2011-08-30 NOTE — MAU Note (Signed)
Missed period 2 months, has been cramping.  Was at urgent care yesterday.  Test was positive.  Wants confirmation.  Explained we use the exact tests they do, if they said positive it is positive.  No cultures or further testing was done

## 2011-08-31 LAB — URINE CULTURE: Culture  Setup Time: 201305240038

## 2011-08-31 LAB — GC/CHLAMYDIA PROBE AMP, GENITAL: Chlamydia, DNA Probe: POSITIVE — AB

## 2011-09-05 ENCOUNTER — Telehealth (HOSPITAL_COMMUNITY): Payer: Self-pay | Admitting: *Deleted

## 2011-10-06 ENCOUNTER — Emergency Department (HOSPITAL_COMMUNITY)
Admission: EM | Admit: 2011-10-06 | Discharge: 2011-10-06 | Disposition: A | Discharge status: Home / Self Care | Payer: Medicaid Other | Attending: Emergency Medicine | Admitting: Emergency Medicine

## 2011-10-06 ENCOUNTER — Emergency Department (HOSPITAL_COMMUNITY): Payer: Medicaid Other

## 2011-10-06 DIAGNOSIS — Z3490 Encounter for supervision of normal pregnancy, unspecified, unspecified trimester: Secondary | ICD-10-CM

## 2011-10-06 DIAGNOSIS — R0602 Shortness of breath: Secondary | ICD-10-CM | POA: Insufficient documentation

## 2011-10-06 DIAGNOSIS — F17200 Nicotine dependence, unspecified, uncomplicated: Secondary | ICD-10-CM | POA: Insufficient documentation

## 2011-10-06 DIAGNOSIS — N390 Urinary tract infection, site not specified: Secondary | ICD-10-CM | POA: Insufficient documentation

## 2011-10-06 DIAGNOSIS — F419 Anxiety disorder, unspecified: Secondary | ICD-10-CM

## 2011-10-06 DIAGNOSIS — O2390 Unspecified genitourinary tract infection in pregnancy, unspecified trimester: Secondary | ICD-10-CM | POA: Insufficient documentation

## 2011-10-06 LAB — URINALYSIS, ROUTINE W REFLEX MICROSCOPIC
Bilirubin Urine: NEGATIVE
Glucose, UA: NEGATIVE mg/dL
Ketones, ur: NEGATIVE mg/dL
Protein, ur: 100 mg/dL — AB

## 2011-10-06 LAB — URINE MICROSCOPIC-ADD ON

## 2011-10-06 MED ORDER — CEPHALEXIN 500 MG PO CAPS: 500.0000 mg | ORAL_CAPSULE | Freq: Once | ORAL | Status: DC

## 2011-10-06 MED ORDER — CEPHALEXIN 500 MG PO CAPS: 500.0000 mg | ORAL_CAPSULE | Freq: Two times a day (BID) | ORAL | Status: AC

## 2011-10-06 NOTE — ED Provider Notes (Signed)
Medical screening examination/treatment/procedure(s) were performed by non-physician practitioner and as supervising physician I was immediately available for consultation/collaboration.   Gwyneth Sprout, MD 10/06/11 2100
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
## 2011-10-06 NOTE — ED Notes (Signed)
Unable to hear fetal heart tones by portable monitor

## 2011-10-06 NOTE — Discharge Instructions (Signed)
Recommend followup with non-emergent complete OB 14+ wk US examination for fetal biometric evaluation and anatomic survey   Urinary Tract Infection in Pregnancy  A urinary tract infection (UTI) is a bacterial infection of the urinary tract. Infection of the urinary tract can include the ureters, kidneys (pyelonephritis), bladder (cystitis), and urethra (urethritis). All pregnant women should be screened for bacteria in the urinary tract. Identifying and treating a UTI will decrease the risk of preterm labor and developing more serious infections in both the mother and baby.  CAUSES  Bacteria germs cause almost all UTIs. There are many factors that can increase your chances of getting a UTI during pregnancy. These include:  Having a short urethra.  Poor toilet and hygiene habits.  Sexual intercourse.  Blockage of urine along the urinary tract.  Problems with the pelvic muscles or nerves.  Diabetes.  Obesity.  Bladder problems after having several children.  Previous history of UTI.  SYMPTOMS  Pain, burning, or a stinging feeling when urinating.  Suddenly feeling the need to urinate right away (urgency).  Loss of bladder control (urinary incontinence).  Frequent urination, more than is common with pregnancy.  Lower abdominal or back discomfort.  Bad smelling urine.  Cloudy urine.  Blood in the urine (hematuria).  Fever.  When the kidneys are infected, the symptoms may be:  Back pain.  Flank pain on the right side more so than the left.  Fever.  Chills.  Nausea.  Vomiting.  DIAGNOSIS  Urine tests.  Additional tests and procedures may include:  Ultrasound of the kidneys, ureters, bladder, and urethra.  Looking in the bladder with a lighted tube (cystoscopy).  Certain X-ray studies only when absolutely necessary.  Finding out the results of your test  Ask when your test results will be ready. Make sure you get your test results.  TREATMENT  Antibiotic medicine by mouth.    Antibiotics given through the vein (intravenously), if needed.  HOME CARE INSTRUCTIONS  Take your antibiotics as directed. Finish them even if you start to feel better. Only take medicine as directed by your caregiver.  Drink enough fluids to keep your urine clear or pale yellow.  Do not have sexual intercourse until the infection is gone and your caregiver says it is okay.  Make sure you are tested for UTIs throughout your pregnancy if you get one. These infections often come back.  Preventing a UTI in the future:  Practice good toilet habits. Always wipe from front to back. Use the tissue only once.  Do not hold your urine. Empty your bladder as soon as possible when the urge comes.  Do not douche or use deodorant sprays.  Wash with soap and warm water around the genital area and the anus.  Empty your bladder before and after sexual intercourse.  Wear underwear with a cotton crotch.  Avoid caffeine and carbonated drinks. They can irritate the bladder.  Drink cranberry juice or take cranberry pills. This may decrease the risk of getting a UTI.  Do not drink alcohol.  Keep all your appointments and tests as scheduled.  SEEK MEDICAL CARE IF:  Your symptoms get worse.  You are still having fevers 2 or more days after treatment begins.  You develop a rash.  You feel that you are having problems with medicines prescribed.  You develop abnormal vaginal discharge.  SEEK IMMEDIATE MEDICAL CARE IF:  You develop back or flank pain.  You develop chills.  You have blood in your urine.  You develop nausea and vomiting.  You develop contractions of your uterus.  You have a gush of fluid from the vagina.  MAKE SURE YOU:  Understand these instructions.  Will watch your condition.  Will get help right away if you are not doing well or get worse.  Document Released: 07/21/2010 Document Revised: 03/15/2011 Document Reviewed: 07/21/2010  [facility] Patient Information 2012 [place], [place].

## 2011-10-06 NOTE — ED Notes (Signed)
Ultrasound at pt bedside 

## 2011-10-29 LAB — OB RESULTS CONSOLE RPR: RPR: NONREACTIVE

## 2011-10-29 LAB — OB RESULTS CONSOLE HEPATITIS B SURFACE ANTIGEN: Hepatitis B Surface Ag: NEGATIVE

## 2011-10-30 ENCOUNTER — Emergency Department (HOSPITAL_COMMUNITY)
Admission: EM | Admit: 2011-10-30 | Discharge: 2011-10-30 | Disposition: A | Discharge status: Home / Self Care | Payer: Medicaid Other | Attending: Emergency Medicine | Admitting: Emergency Medicine

## 2011-10-30 ENCOUNTER — Encounter (HOSPITAL_COMMUNITY): Payer: Self-pay | Admitting: Neurology

## 2011-10-30 DIAGNOSIS — Z049 Encounter for examination and observation for unspecified reason: Secondary | ICD-10-CM | POA: Insufficient documentation

## 2011-10-30 DIAGNOSIS — F17200 Nicotine dependence, unspecified, uncomplicated: Secondary | ICD-10-CM | POA: Insufficient documentation

## 2011-10-30 DIAGNOSIS — O99891 Other specified diseases and conditions complicating pregnancy: Secondary | ICD-10-CM | POA: Insufficient documentation

## 2011-10-30 NOTE — ED Notes (Signed)
Pt discharged home with family. A x 4. NAD
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
## 2011-10-30 NOTE — ED Notes (Signed)
Pt denying any pain. Denying any abdominal pain or vaginal bleeding. Pt reporting 18 weeks. Pt injuries noted. Skin intact. No bruising or swelling noted. A x 4

## 2011-10-31 NOTE — ED Provider Notes (Signed)
Medical screening examination/treatment/procedure(s) were performed by non-physician practitioner and as supervising physician I was immediately available for consultation/collaboration.  Cheri Guppy, MD 10/31/11 [phone]

## 2011-11-16 ENCOUNTER — Encounter (HOSPITAL_COMMUNITY): Payer: Self-pay | Admitting: *Deleted

## 2011-11-16 ENCOUNTER — Inpatient Hospital Stay (HOSPITAL_COMMUNITY)
Admission: AD | Admit: 2011-11-16 | Discharge: 2011-11-16 | Disposition: A | Discharge status: Home / Self Care | Payer: Medicaid Other | Source: Ambulatory Visit | Attending: Obstetrics | Admitting: Obstetrics

## 2011-11-16 DIAGNOSIS — J069 Acute upper respiratory infection, unspecified: Secondary | ICD-10-CM | POA: Insufficient documentation

## 2011-11-16 DIAGNOSIS — O99891 Other specified diseases and conditions complicating pregnancy: Secondary | ICD-10-CM | POA: Insufficient documentation

## 2011-11-16 HISTORY — DX: Other specified health status: Z78.9

## 2011-11-16 LAB — URINALYSIS, ROUTINE W REFLEX MICROSCOPIC
Hgb urine dipstick: NEGATIVE
Nitrite: NEGATIVE
Specific Gravity, Urine: 1.01 (ref 1.005–1.030)
Urobilinogen, UA: 0.2 mg/dL (ref 0.0–1.0)

## 2011-11-16 LAB — URINE MICROSCOPIC-ADD ON

## 2011-11-16 NOTE — MAU Note (Signed)
Been having chills/and feeling hot.  Checked temp last night (one and only time) was 103.  Denies pain, n/v/d/ or constipation. No urinary symptoms or complaints. No one else at home is sick.  No cough. Also seems like stomach is getting smaller.
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
## 2011-11-23 IMAGING — CR DG ANKLE COMPLETE 3+V*R*
3 series · 3 of 3 positions shown · non-contrast
Comparison: 07/03/2005

CLINICAL DATA: Fell.  Right ankle pain.

RIGHT ANKLE - COMPLETE 3+ VIEW

[t ankle joint ap right]
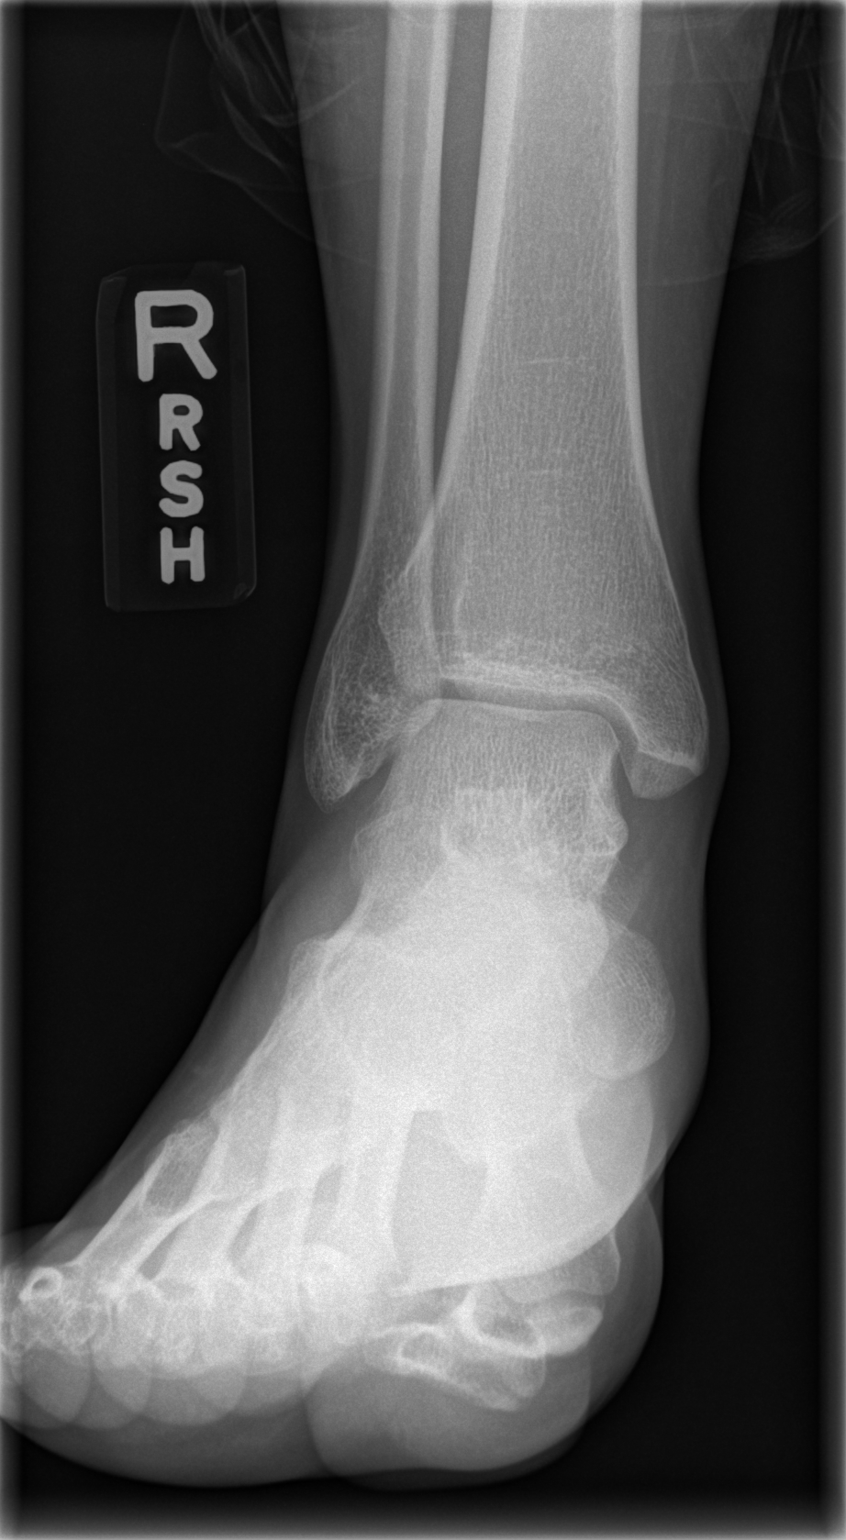

[t ankle joint oblique right]
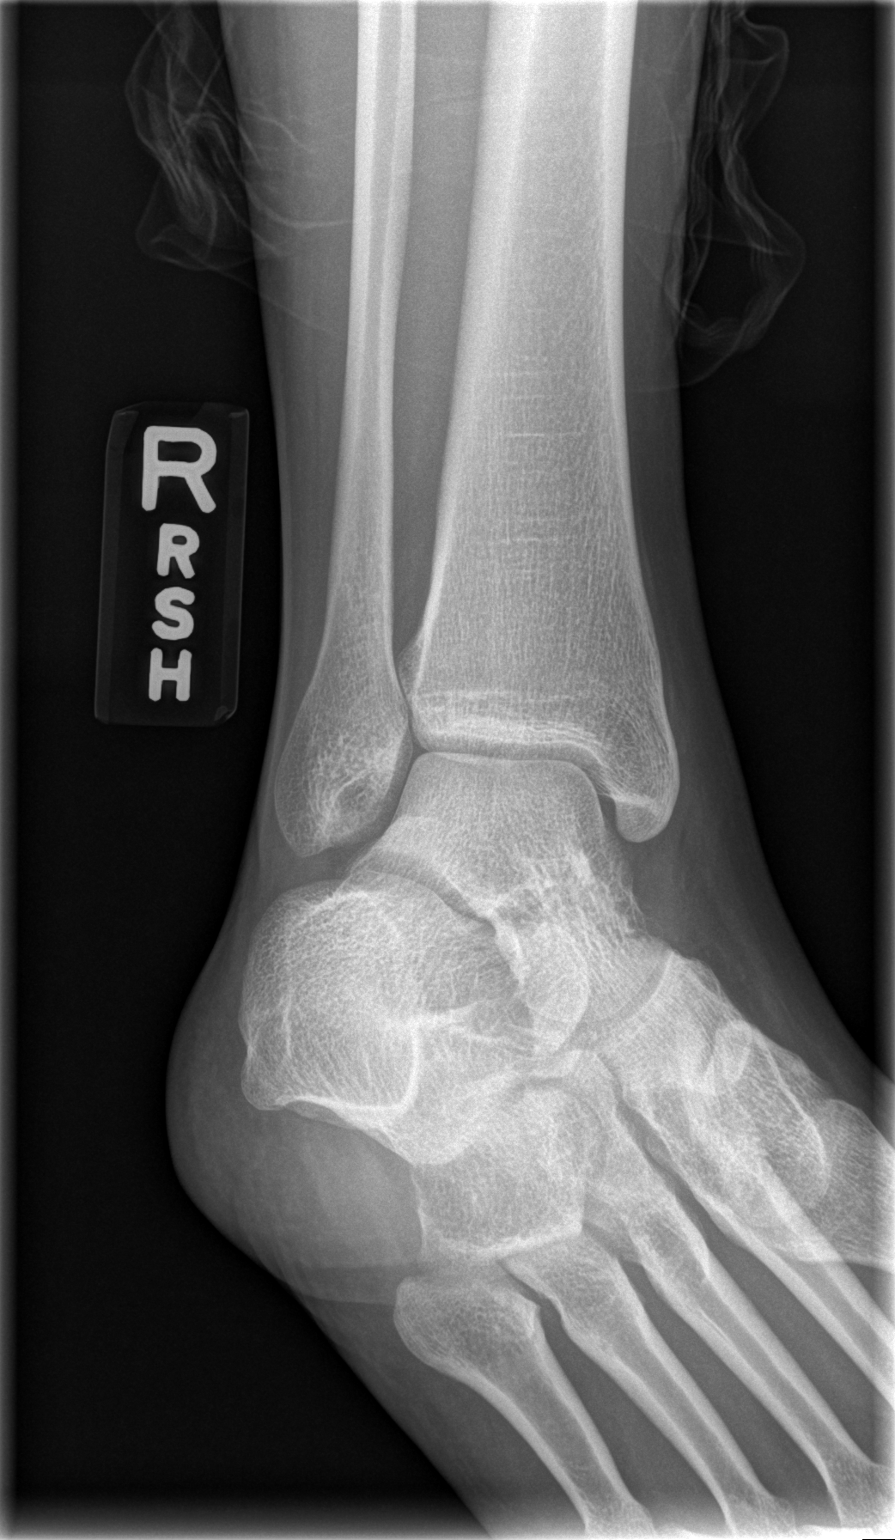

[t ankle joint lat right]
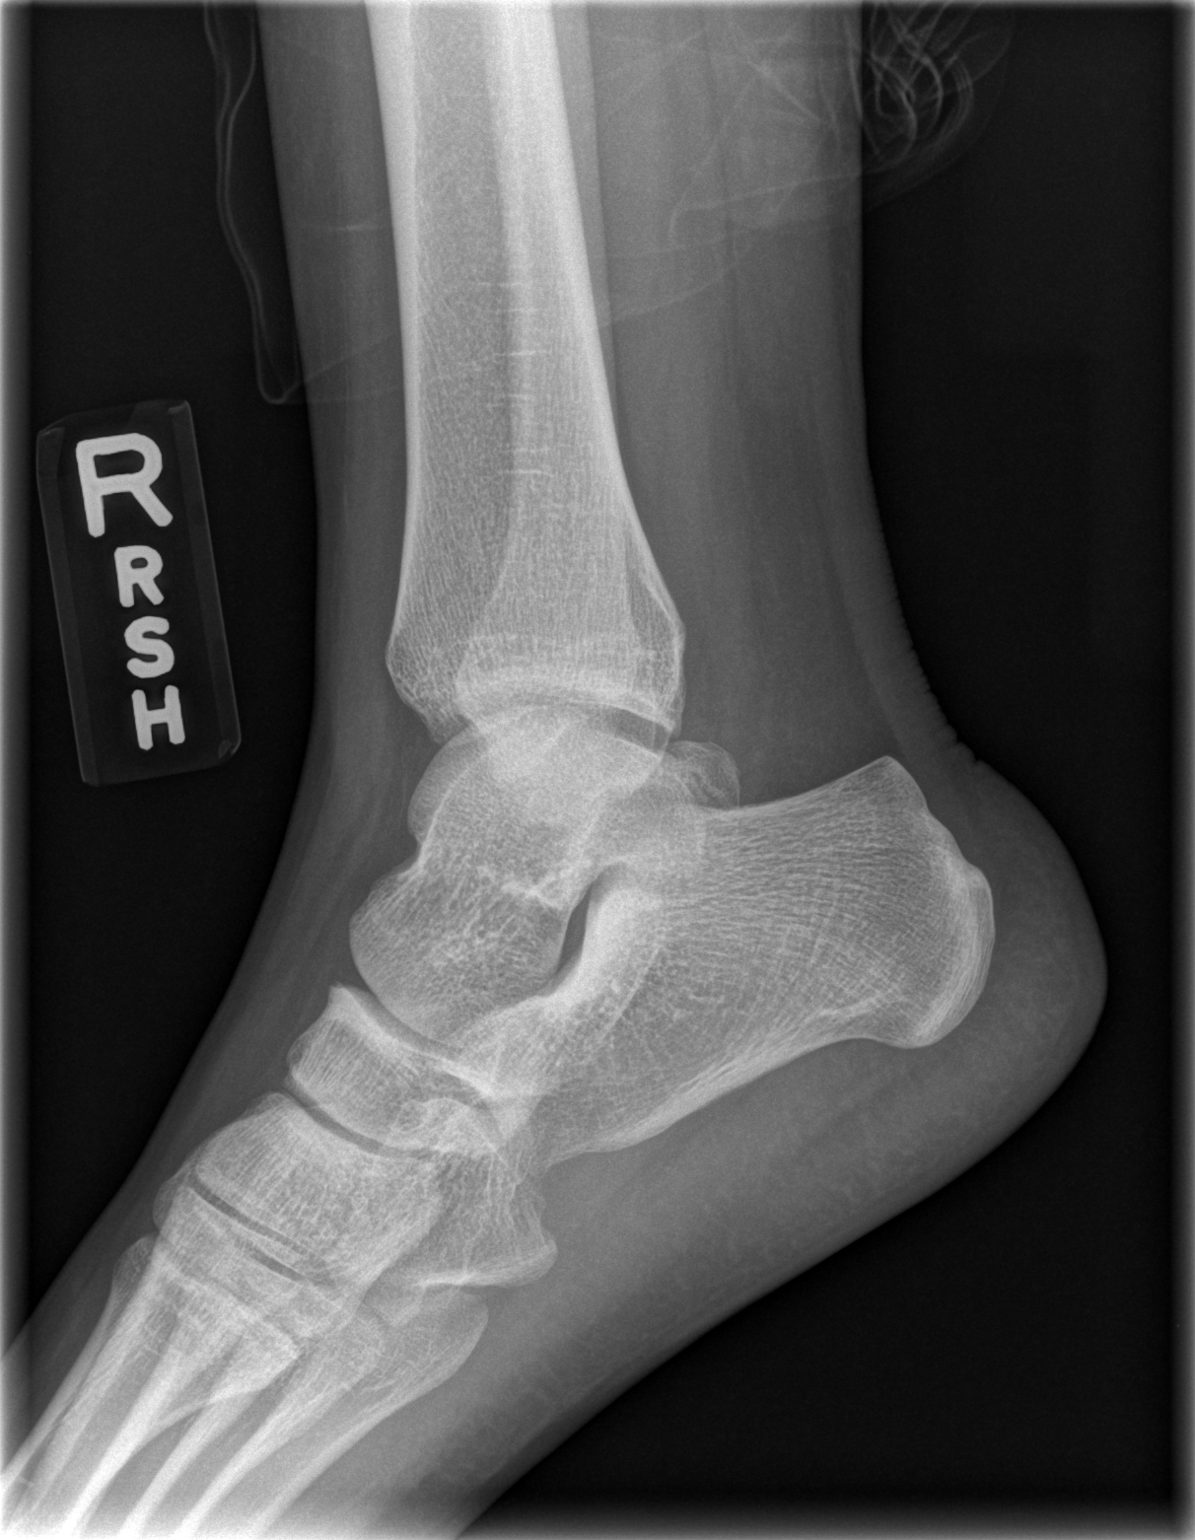

[3 of 3 positions shown; findings below may reference images not displayed]

FINDINGS: The ankle mortise is maintained.  No acute fracture.  Os
trigonum is again noted.  The mid and hind foot bony structures
appear normal and stable.
IMPRESSION: No acute bony findings.

## 2012-02-14 LAB — OB RESULTS CONSOLE ANTIBODY SCREEN: Antibody Screen: NEGATIVE

## 2012-02-14 LAB — OB RESULTS CONSOLE GBS: GBS: NEGATIVE

## 2012-03-26 ENCOUNTER — Inpatient Hospital Stay (EMERGENCY_DEPARTMENT_HOSPITAL)
Admission: AD | Admit: 2012-03-26 | Discharge: 2012-03-27 | Disposition: A | Discharge status: Home / Self Care | Payer: Medicaid Other | Source: Ambulatory Visit | Attending: Obstetrics & Gynecology | Admitting: Obstetrics & Gynecology

## 2012-03-26 ENCOUNTER — Encounter (HOSPITAL_COMMUNITY): Payer: Self-pay

## 2012-03-26 ENCOUNTER — Inpatient Hospital Stay (HOSPITAL_COMMUNITY)
Admission: AD | Admit: 2012-03-26 | Discharge: 2012-03-26 | Disposition: A | Discharge status: Home / Self Care | Payer: Medicaid Other | Source: Ambulatory Visit | Attending: Obstetrics & Gynecology | Admitting: Obstetrics & Gynecology

## 2012-03-26 DIAGNOSIS — O471 False labor at or after 37 completed weeks of gestation: Secondary | ICD-10-CM

## 2012-03-26 DIAGNOSIS — O479 False labor, unspecified: Secondary | ICD-10-CM | POA: Insufficient documentation

## 2012-03-26 DIAGNOSIS — O4690 Antepartum hemorrhage, unspecified, unspecified trimester: Secondary | ICD-10-CM | POA: Insufficient documentation

## 2012-03-26 NOTE — MAU Note (Signed)
Pt was discharged, but signed back in before leaving hospital stating that when she went to bathroom before leaving she had a spot of blood in her underwear and blood noted on the tissue after wiping.  Pt states she was educated on spotting after a cervical exam, but wasn't sure how much bleeding was too much.

## 2012-03-26 NOTE — MAU Note (Signed)
Contractions every 6 minutes x2 hours. Bright red spotting on toilet paper this evening. Denies leaking of fluid.

## 2012-03-27 ENCOUNTER — Inpatient Hospital Stay (HOSPITAL_COMMUNITY): Payer: Medicaid Other | Admitting: Anesthesiology

## 2012-03-27 ENCOUNTER — Inpatient Hospital Stay (HOSPITAL_COMMUNITY)
Admission: AD | Admit: 2012-03-27 | Discharge: 2012-03-29 | DRG: 775 | Disposition: A | Discharge status: Home / Self Care | Payer: Medicaid Other | Source: Ambulatory Visit | Attending: Obstetrics & Gynecology | Admitting: Obstetrics & Gynecology

## 2012-03-27 ENCOUNTER — Encounter (HOSPITAL_COMMUNITY): Payer: Self-pay | Admitting: *Deleted

## 2012-03-27 ENCOUNTER — Encounter (HOSPITAL_COMMUNITY): Payer: Self-pay | Admitting: Anesthesiology

## 2012-03-27 DIAGNOSIS — O4700 False labor before 37 completed weeks of gestation, unspecified trimester: Secondary | ICD-10-CM

## 2012-03-27 LAB — CBC
HCT: 35.4 % — ABNORMAL LOW (ref 36.0–46.0)
MCH: 28.9 pg (ref 26.0–34.0)
MCHC: 34.2 g/dL (ref 30.0–36.0)
RDW: 14.7 % (ref 11.5–15.5)

## 2012-03-27 LAB — RPR: RPR Ser Ql: NONREACTIVE

## 2012-03-27 MED ORDER — LIDOCAINE HCL (PF) 1 % IJ SOLN
INTRAMUSCULAR | Status: DC | PRN
  Administered 2012-03-27: 30 mL
  Administered 2012-03-27 (×2): 8 mL

## 2012-03-27 MED ORDER — DEXTROSE 5 % IV SOLN
2.0000 g | Freq: Once | INTRAVENOUS | Status: AC
  Administered 2012-03-27: 2 g via INTRAVENOUS
  Filled 2012-03-27: qty 2

## 2012-03-27 MED ORDER — SIMETHICONE 80 MG PO CHEW: 80.0000 mg | CHEWABLE_TABLET | ORAL | Status: DC | PRN

## 2012-03-27 MED ORDER — ONDANSETRON HCL 4 MG/2ML IJ SOLN: 4.0000 mg | Freq: Four times a day (QID) | INTRAMUSCULAR | Status: DC | PRN

## 2012-03-27 MED ORDER — DIPHENHYDRAMINE HCL 25 MG PO CAPS: 25.0000 mg | ORAL_CAPSULE | Freq: Four times a day (QID) | ORAL | Status: DC | PRN

## 2012-03-27 MED ORDER — OXYTOCIN 40 UNITS IN LACTATED RINGERS INFUSION - SIMPLE MED
62.5000 mL/h | INTRAVENOUS | Status: DC
  Administered 2012-03-27: 62.5 mL/h via INTRAVENOUS
  Filled 2012-03-27: qty 1000

## 2012-03-27 MED ORDER — AZITHROMYCIN 250 MG PO TABS
1000.0000 mg | ORAL_TABLET | Freq: Once | ORAL | Status: AC
  Administered 2012-03-27: 1000 mg via ORAL
  Filled 2012-03-27: qty 4

## 2012-03-27 MED ORDER — ZOLPIDEM TARTRATE 5 MG PO TABS: 5.0000 mg | ORAL_TABLET | Freq: Every evening | ORAL | Status: DC | PRN

## 2012-03-27 MED ORDER — LACTATED RINGERS IV SOLN: 500.0000 mL | INTRAVENOUS | Status: DC | PRN

## 2012-03-27 MED ORDER — FENTANYL 2.5 MCG/ML BUPIVACAINE 1/10 % EPIDURAL INFUSION (WH - ANES)
INTRAMUSCULAR | Status: DC | PRN
  Administered 2012-03-27: 14 mL/h via EPIDURAL

## 2012-03-27 MED ORDER — NALBUPHINE SYRINGE 5 MG/0.5 ML
10.0000 mg | INJECTION | Freq: Four times a day (QID) | INTRAMUSCULAR | Status: DC | PRN
  Filled 2012-03-27: qty 1

## 2012-03-27 MED ORDER — WITCH HAZEL-GLYCERIN EX PADS: 1.0000 "application " | MEDICATED_PAD | CUTANEOUS | Status: DC | PRN

## 2012-03-27 MED ORDER — IBUPROFEN 600 MG PO TABS
600.0000 mg | ORAL_TABLET | Freq: Four times a day (QID) | ORAL | Status: DC | PRN
  Filled 2012-03-27 (×2): qty 1

## 2012-03-27 MED ORDER — IBUPROFEN 600 MG PO TABS
600.0000 mg | ORAL_TABLET | Freq: Four times a day (QID) | ORAL | Status: DC
  Administered 2012-03-27 – 2012-03-29 (×6): 600 mg via ORAL
  Filled 2012-03-27 (×4): qty 1

## 2012-03-27 MED ORDER — OXYCODONE-ACETAMINOPHEN 5-325 MG PO TABS: 1.0000 | ORAL_TABLET | ORAL | Status: DC | PRN

## 2012-03-27 MED ORDER — OXYCODONE-ACETAMINOPHEN 5-325 MG PO TABS
1.0000 | ORAL_TABLET | ORAL | Status: DC | PRN
  Administered 2012-03-28 (×3): 1 via ORAL
  Filled 2012-03-27 (×3): qty 1

## 2012-03-27 MED ORDER — FERROUS SULFATE 325 (65 FE) MG PO TABS
325.0000 mg | ORAL_TABLET | Freq: Two times a day (BID) | ORAL | Status: DC
  Administered 2012-03-28 – 2012-03-29 (×3): 325 mg via ORAL
  Filled 2012-03-27 (×3): qty 1

## 2012-03-27 MED ORDER — BENZOCAINE-MENTHOL 20-0.5 % EX AERO
1.0000 "application " | INHALATION_SPRAY | CUTANEOUS | Status: DC | PRN
  Administered 2012-03-28: 1 via TOPICAL
  Filled 2012-03-27: qty 56

## 2012-03-27 MED ORDER — EPHEDRINE 5 MG/ML INJ: 10.0000 mg | INTRAVENOUS | Status: DC | PRN

## 2012-03-27 MED ORDER — PROMETHAZINE HCL 25 MG/ML IJ SOLN: 25.0000 mg | Freq: Four times a day (QID) | INTRAMUSCULAR | Status: DC | PRN

## 2012-03-27 MED ORDER — FENTANYL 2.5 MCG/ML BUPIVACAINE 1/10 % EPIDURAL INFUSION (WH - ANES)
14.0000 mL/h | INTRAMUSCULAR | Status: DC
  Administered 2012-03-27: 14 mL/h via EPIDURAL
  Filled 2012-03-27 (×2): qty 125

## 2012-03-27 MED ORDER — DIPHENHYDRAMINE HCL 50 MG/ML IJ SOLN: 12.5000 mg | INTRAMUSCULAR | Status: DC | PRN

## 2012-03-27 MED ORDER — LACTATED RINGERS IV SOLN: 500.0000 mL | Freq: Once | INTRAVENOUS | Status: DC

## 2012-03-27 MED ORDER — DIBUCAINE 1 % RE OINT: 1.0000 "application " | TOPICAL_OINTMENT | RECTAL | Status: DC | PRN

## 2012-03-27 MED ORDER — LACTATED RINGERS IV SOLN
INTRAVENOUS | Status: DC
  Administered 2012-03-27 (×4): via INTRAVENOUS

## 2012-03-27 MED ORDER — PHENYLEPHRINE 40 MCG/ML (10ML) SYRINGE FOR IV PUSH (FOR BLOOD PRESSURE SUPPORT): 80.0000 ug | PREFILLED_SYRINGE | INTRAVENOUS | Status: DC | PRN

## 2012-03-27 MED ORDER — NALBUPHINE SYRINGE 5 MG/0.5 ML
10.0000 mg | INJECTION | INTRAMUSCULAR | Status: DC | PRN
  Filled 2012-03-27: qty 1

## 2012-03-27 MED ORDER — PRENATAL MULTIVITAMIN CH
1.0000 | ORAL_TABLET | Freq: Every day | ORAL | Status: DC
  Administered 2012-03-28 – 2012-03-29 (×2): 1 via ORAL
  Filled 2012-03-27 (×2): qty 1

## 2012-03-27 MED ORDER — ONDANSETRON HCL 4 MG PO TABS: 4.0000 mg | ORAL_TABLET | ORAL | Status: DC | PRN

## 2012-03-27 MED ORDER — TERBUTALINE SULFATE 1 MG/ML IJ SOLN: 0.2500 mg | Freq: Once | INTRAMUSCULAR | Status: DC | PRN

## 2012-03-27 MED ORDER — OXYTOCIN 40 UNITS IN LACTATED RINGERS INFUSION - SIMPLE MED
1.0000 m[IU]/min | INTRAVENOUS | Status: DC
  Administered 2012-03-27: 1 m[IU]/min via INTRAVENOUS

## 2012-03-27 MED ORDER — TETANUS-DIPHTH-ACELL PERTUSSIS 5-2.5-18.5 LF-MCG/0.5 IM SUSP: 0.5000 mL | Freq: Once | INTRAMUSCULAR | Status: DC

## 2012-03-27 MED ORDER — LANOLIN HYDROUS EX OINT: TOPICAL_OINTMENT | CUTANEOUS | Status: DC | PRN

## 2012-03-27 MED ORDER — ONDANSETRON HCL 4 MG/2ML IJ SOLN: 4.0000 mg | INTRAMUSCULAR | Status: DC | PRN

## 2012-03-27 MED ORDER — EPHEDRINE 5 MG/ML INJ
10.0000 mg | INTRAVENOUS | Status: DC | PRN
  Filled 2012-03-27: qty 4

## 2012-03-27 MED ORDER — ACETAMINOPHEN 325 MG PO TABS: 650.0000 mg | ORAL_TABLET | ORAL | Status: DC | PRN

## 2012-03-27 MED ORDER — FLEET ENEMA 7-19 GM/118ML RE ENEM: 1.0000 | ENEMA | RECTAL | Status: DC | PRN

## 2012-03-27 MED ORDER — PHENYLEPHRINE 40 MCG/ML (10ML) SYRINGE FOR IV PUSH (FOR BLOOD PRESSURE SUPPORT)
80.0000 ug | PREFILLED_SYRINGE | INTRAVENOUS | Status: DC | PRN
  Filled 2012-03-27: qty 5

## 2012-03-27 MED ORDER — CITRIC ACID-SODIUM CITRATE 334-500 MG/5ML PO SOLN: 30.0000 mL | ORAL | Status: DC | PRN

## 2012-03-27 MED ORDER — LIDOCAINE HCL (PF) 1 % IJ SOLN
30.0000 mL | INTRAMUSCULAR | Status: DC | PRN
  Filled 2012-03-27 (×2): qty 30

## 2012-03-27 MED ORDER — SENNOSIDES-DOCUSATE SODIUM 8.6-50 MG PO TABS
2.0000 | ORAL_TABLET | Freq: Every day | ORAL | Status: DC
  Administered 2012-03-28: 2 via ORAL

## 2012-03-27 MED ORDER — OXYTOCIN BOLUS FROM INFUSION: 500.0000 mL | INTRAVENOUS | Status: DC

## 2012-03-27 NOTE — Anesthesia Procedure Notes (Signed)
Epidural Patient location during procedure: OB Start time: 03/27/2012 8:48 AM End time: 03/27/2012 8:52 AM  Staffing Anesthesiologist: Sandrea Hughs Performed by: anesthesiologist   Preanesthetic Checklist Completed: patient identified, site marked, surgical consent, pre-op evaluation, timeout performed, IV checked, risks and benefits discussed and monitors and equipment checked  Epidural Patient position: sitting Prep: site prepped and draped and DuraPrep Patient monitoring: continuous pulse ox and blood pressure Approach: midline Injection technique: LOR air  Needle:  Needle type: Tuohy  Needle gauge: 17 G Needle length: 9 cm and 9 Needle insertion depth: 5 cm cm Catheter type: closed end flexible Catheter size: 19 Gauge Catheter at skin depth: 10 cm Test dose: negative and Other  Assessment Sensory level: T9 Events: blood not aspirated, injection not painful, no injection resistance, negative IV test and no paresthesia  Additional Notes Reason for block:procedure for pain

## 2012-03-27 NOTE — Anesthesia Preprocedure Evaluation (Signed)

## 2012-03-27 NOTE — MAU Note (Signed)
Pt presents for contractions that have worsened in intensity throughout the night.  She states they are now apart.  Bloody show noted.  Reports good fetal movement.  Denies any LOF.

## 2012-03-27 NOTE — H&P (Signed)
Beth Vargas is a 21 y.o. female presenting for UC's. Maternal Medical History:  Reason for admission: Reason for admission: contractions.  21 yo G1 P0.  EDC 03-27-12.  Presents with UC's.  Contractions: Onset was 6-12 hours ago.   Frequency: regular.   Perceived severity is strong.    Fetal activity: Perceived fetal activity is normal.   Last perceived fetal movement was within the past hour.    Prenatal complications: no prenatal complications Prenatal Complications - Diabetes: none.    OB History    Grav Para Term Preterm Abortions TAB SAB Ect Mult Living   1 0 0 0 0 0 0 0 0 0      Past Medical History  Diagnosis Date  . No pertinent past medical history    Past Surgical History  Procedure Date  . No past surgeries    Family History: family history includes Hypertension in her mother. Social History:  reports that she quit smoking about 5 months ago. She has never used smokeless tobacco. She reports that she does not drink alcohol or use illicit drugs.   Prenatal Transfer Tool  Maternal Diabetes: No Genetic Screening: Normal Maternal Ultrasounds/Referrals: Normal Fetal Ultrasounds or other Referrals:  None Maternal Substance Abuse:  No Significant Maternal Medications:  Meds include: Other:   PNV Significant Maternal Lab Results:  Lab values include: Group B Strep negative Other Comments:  None  Review of Systems  All other systems reviewed and are negative.    Dilation: 5 Effacement (%): 100 Station: -1 Exam by:: Aggie Moats, RN Blood pressure 126/72, pulse 108, temperature 98.6 F (37 C), temperature source Oral, resp. rate 20, height 5\' 3"  (1.6 m), weight 159 lb (72.122 kg), last menstrual period 06/21/2011. Maternal Exam:  Uterine Assessment: Contraction strength is firm.  Contraction frequency is regular.   Abdomen: Patient reports no abdominal tenderness. Fetal presentation: vertex  Introitus: Normal vulva. Normal vagina.  Pelvis: adequate  for delivery.   Cervix: Cervix evaluated by digital exam.     Physical Exam  Constitutional: She is oriented to person, place, and time. She appears well-developed and well-nourished.  HENT:  Head: Normocephalic and atraumatic.  Eyes: Conjunctivae normal are normal. Pupils are equal, round, and reactive to light.  Neck: Normal range of motion. Neck supple.  Cardiovascular: Normal rate and regular rhythm.   Respiratory: Effort normal.  GI: Soft.  Genitourinary: Vagina normal and uterus normal.  Musculoskeletal: Normal range of motion.  Neurological: She is alert and oriented to person, place, and time.  Skin: Skin is warm and dry.  Psychiatric: She has a normal mood and affect. Her behavior is normal. Judgment and thought content normal.    Prenatal labs: ABO, Rh:   Antibody:   Rubella:   RPR:    HBsAg:    HIV:    GBS:     Assessment/Plan: 40 weeks.  Active labor.  Admit.   HARPER,CHARLES A 03/27/2012, 7:42 AM

## 2012-03-27 NOTE — Progress Notes (Signed)
Beth Vargas is a 21 y.o. G1P0000 at [redacted]w[redacted]d by LMP admitted for active labor  Subjective:   Objective: BP 133/72  Pulse 116  Temp 99.4 F (37.4 C) (Oral)  Resp 20  Ht 5\' 3"  (1.6 m)  Wt 159 lb (72.122 kg)  BMI 28.17 kg/m2  SpO2 100%  LMP 06/21/2011      FHT:  FHR: 150-10 bpm, variability: moderate,  accelerations:  Present,  decelerations:  Absent UC:   regular, every 2-3 minutes SVE:   Dilation: 8.5 Effacement (%): 100 Station: 0 Exam by:: Clearence Cheek RN  Labs: Lab Results  Component Value Date   WBC 11.8* 03/27/2012   HGB 12.1 03/27/2012   HCT 35.4* 03/27/2012   MCV 84.7 03/27/2012   PLT 192 03/27/2012    Assessment / Plan: Spontaneous labor, progressing normally  Labor: Progressing normally Preeclampsia:  n/a Fetal Wellbeing:  Category I Pain Control:  Epidural I/D:  n/a Anticipated MOD:  NSVD  HARPER,CHARLES A 03/27/2012, 4:52 PM

## 2012-03-27 NOTE — MAU Provider Note (Signed)
  History     CSN: [id number]  Arrival date and time: 03/26/12 2343   None     Chief Complaint  Patient presents with  . Vaginal Bleeding   HPI  Beth Vargas is a 21 y.o. G1P0000 who was seen today for labor evaluation. She was found to be 1cm, and sent home. While in the bathroom upon leaving she had increased vaginal bleeding and checked back in to be seen. She denies any pain, other than some mild UC's. Denies LOF, +FM. She states that it was mucous-y  blood.   Past Medical History  Diagnosis Date  . No pertinent past medical history     Past Surgical History  Procedure Date  . No past surgeries     Family History  Problem Relation Age of Onset  . Hypertension Mother     History  Substance Use Topics  . Smoking status: Former Smoker -- 0.2 packs/day    Quit date: 10/21/2011  . Smokeless tobacco: Never Used  . Alcohol Use: No    Allergies: No Known Allergies  Prescriptions prior to admission  Medication Sig Dispense Refill  . Prenatal Vit-Fe Fumarate-FA (PRENATAL MULTIVITAMIN) TABS Take 1 tablet by mouth daily.        Review of Systems  Constitutional: Negative for fever.  Gastrointestinal: Negative for nausea, vomiting, abdominal pain, diarrhea and constipation.  Genitourinary: Negative for dysuria, urgency and frequency.  Neurological: Negative for headaches.   Physical Exam   Blood pressure 146/85, pulse 85, temperature 97.9 F (36.6 C), temperature source Oral, resp. rate 20, last menstrual period 06/21/2011.  Physical Exam  Nursing note and vitals reviewed. Constitutional: She is oriented to person, place, and time. She appears well-developed and well-nourished. No distress.  Cardiovascular: Normal rate.   Respiratory: Effort normal.  Genitourinary:        External: normal Vagina: small amount of mucous-y blood in the vagina. Cleared with faux swabs. Once all the mucous had been cleared there was no evidence of any active bleeding Cervix:  appeared about 1cm dilated.  Digital exam deferred so as not to cause any more bleeding.   FHT: 130, moderate with 15x15 accels. No Decels Toco: q 3 min, mild to palpation.     Neurological: She is alert and oriented to person, place, and time.  Skin: Skin is warm and dry.    MAU Course  Procedures  Dr. Tamela Oddi notified. OK for DC home.    Assessment and Plan  Early/prodromal labor Lost mucous plug  Labor precautions  3rd trimester danger signs reviewed FU with PCP tomorrow as scheduled.   Tawnya Crook 03/27/2012, 12:12 AM

## 2012-03-27 NOTE — Progress Notes (Signed)
Beth Vargas is a 21 y.o. G1P0000 at [redacted]w[redacted]d by LMP admitted for active labor  Subjective:   Objective: BP 111/61  Pulse 109  Temp 98.4 F (36.9 C) (Oral)  Resp 20  Ht 5\' 3"  (1.6 m)  Wt 159 lb (72.122 kg)  BMI 28.17 kg/m2  SpO2 100%  LMP 06/21/2011      FHT:  FHR: 150-160 bpm, variability: moderate,  accelerations:  Present,  decelerations:  Absent UC:   regular, every 2-3 minutes SVE:   Dilation: 7.5 Effacement (%): 100 Station: 0 Exam by:: Beth Cheek RN  Labs: Lab Results  Component Value Date   WBC 11.8* 03/27/2012   HGB 12.1 03/27/2012   HCT 35.4* 03/27/2012   MCV 84.7 03/27/2012   PLT 192 03/27/2012    Assessment / Plan: Spontaneous labor, progressing normally  Labor: Progressing normally Preeclampsia:  n/a Fetal Wellbeing:  Category I Pain Control:  Epidural I/D:  n/a Anticipated MOD:  NSVD  Beth Vargas A 03/27/2012, 12:22 PM

## 2012-03-28 LAB — CBC
HCT: 31 % — ABNORMAL LOW (ref 36.0–46.0)
Hemoglobin: 10.5 g/dL — ABNORMAL LOW (ref 12.0–15.0)
MCH: 28.8 pg (ref 26.0–34.0)
RBC: 3.65 MIL/uL — ABNORMAL LOW (ref 3.87–5.11)

## 2012-03-28 NOTE — Progress Notes (Signed)
Post Partum Day 1 Subjective: no complaints  Objective: Blood pressure 122/78, pulse 98, temperature 97.5 F (36.4 C), temperature source Oral, resp. rate 18, height 5\' 3"  (1.6 m), weight 159 lb (72.122 kg), last menstrual period 06/21/2011, SpO2 100.00%, unknown if currently breastfeeding.  Physical Exam:  General: alert and no distress Lochia: appropriate Uterine Fundus: firm Incision: healing well DVT Evaluation: No evidence of DVT seen on physical exam.   Basename 03/28/12 0550 03/27/12 0730  HGB 10.5* 12.1  HCT 31.0* 35.4*    Assessment/Plan: Plan for discharge tomorrow   LOS: 1 day   Caterina Racine A 03/28/2012, 2:30 PM

## 2012-03-28 NOTE — Progress Notes (Signed)
UR completed 

## 2012-03-28 NOTE — Anesthesia Postprocedure Evaluation (Signed)
  Anesthesia Post-op Note  Patient: Beth Vargas  Procedure(s) Performed: * No procedures listed *  Patient Location: Mother/Baby  Anesthesia Type:Epidural  Level of Consciousness: awake, alert  and oriented  Airway and Oxygen Therapy: Patient Spontanous Breathing  Post-op Pain: mild  Post-op Assessment: Patient's Cardiovascular Status Stable, Respiratory Function Stable, Patent Airway, No signs of Nausea or vomiting, Adequate PO intake and Pain level controlled  Post-op Vital Signs: stable  Complications: No apparent anesthesia complications

## 2012-03-29 MED ORDER — OXYCODONE-ACETAMINOPHEN 5-325 MG PO TABS: 1.0000 | ORAL_TABLET | ORAL | Status: DC | PRN

## 2012-03-29 MED ORDER — IBUPROFEN 600 MG PO TABS: 600.0000 mg | ORAL_TABLET | Freq: Four times a day (QID) | ORAL | Status: DC | PRN

## 2012-03-29 NOTE — Progress Notes (Signed)
Post Partum Day 2 Subjective: no complaints  Objective: Blood pressure 109/71, pulse 83, temperature 97.5 F (36.4 C), temperature source Oral, resp. rate 18, height 5\' 3"  (1.6 m), weight 159 lb (72.122 kg), last menstrual period 06/21/2011, SpO2 100.00%, unknown if currently breastfeeding.  Physical Exam:  General: alert and no distress Lochia: appropriate Uterine Fundus: firm Incision: healing well DVT Evaluation: No evidence of DVT seen on physical exam.   Basename 03/28/12 0550 03/27/12 0730  HGB 10.5* 12.1  HCT 31.0* 35.4*    Assessment/Plan: Discharge home   LOS: 2 days   Silver Parkey A 03/29/2012, 7:07 AM

## 2012-04-05 NOTE — Discharge Summary (Signed)
Obstetric Discharge Summary Reason for Admission: onset of labor Prenatal Procedures: ultrasound Intrapartum Procedures: spontaneous vaginal delivery Postpartum Procedures: none Complications-Operative and Postpartum: none Hemoglobin  Date Value Range Status  03/28/2012 10.5* 12.0 - 15.0 g/dL Final     HCT  Date Value Range Status  03/28/2012 31.0* 36.0 - 46.0 % Final    Physical Exam:  General: alert and no distress Lochia: appropriate Uterine Fundus: firm Incision: healing well DVT Evaluation: No evidence of DVT seen on physical exam.  Discharge Diagnoses: Term Pregnancy-delivered  Discharge Information: Date: 04/05/2012 Activity: pelvic rest Diet: routine Medications: PNV, Ibuprofen and Percocet Condition: stable Instructions: refer to practice specific booklet Discharge to: home   Newborn Data: Live born female  Birth Weight: 6 lb 8.6 oz (2965 g) APGAR: 9, 9  Home with mother.  Beth Vargas,Beth Vargas 04/05/2012, 7:46 AM

## 2012-07-30 ENCOUNTER — Other Ambulatory Visit: Payer: Self-pay

## 2013-08-04 ENCOUNTER — Inpatient Hospital Stay (HOSPITAL_COMMUNITY)
Admission: AD | Admit: 2013-08-04 | Discharge: 2013-08-05 | Disposition: A | Discharge status: Home / Self Care | Payer: Self-pay | Source: Ambulatory Visit | Attending: Obstetrics & Gynecology | Admitting: Obstetrics & Gynecology

## 2013-08-04 DIAGNOSIS — Z5320 Procedure and treatment not carried out because of patient's decision for unspecified reasons: Secondary | ICD-10-CM | POA: Insufficient documentation

## 2013-08-04 DIAGNOSIS — R109 Unspecified abdominal pain: Secondary | ICD-10-CM | POA: Insufficient documentation

## 2013-08-04 LAB — CBC
HCT: 40.9 % (ref 36.0–46.0)
Hemoglobin: 13.9 g/dL (ref 12.0–15.0)
MCH: 29.6 pg (ref 26.0–34.0)
MCHC: 34 g/dL (ref 30.0–36.0)
MCV: 87 fL (ref 78.0–100.0)
PLATELETS: 240 10*3/uL (ref 150–400)
RBC: 4.7 MIL/uL (ref 3.87–5.11)
RDW: 14.7 % (ref 11.5–15.5)
WBC: 6.5 10*3/uL (ref 4.0–10.5)

## 2013-08-04 LAB — PREGNANCY, URINE: PREG TEST UR: NEGATIVE

## 2013-08-04 NOTE — MAU Note (Signed)
Pt LMP 06/11/2013, wants a pregnancy test.  Having abd cramping, no bleeding or discharge.

## 2013-08-04 NOTE — MAU Provider Note (Signed)
Chief Complaint: Possible Pregnancy and Abdominal Cramping   None    SUBJECTIVE HPI: Beth Vargas is a 23 y.o. G1P1001 who presents with request for pregnancy test and low abd cramping.   LAB RESULTS Results for orders placed during the hospital encounter of 08/04/13 (from the past 24 hour(s))  PREGNANCY, URINE     Status: None   Collection Time    08/04/13  7:50 PM      Result Value Ref Range   Preg Test, Ur NEGATIVE  NEGATIVE  URINALYSIS, ROUTINE W REFLEX MICROSCOPIC     Status: Abnormal   Collection Time    08/04/13  7:50 PM      Result Value Ref Range   Color, Urine YELLOW  YELLOW   APPearance CLOUDY (*) CLEAR   Specific Gravity, Urine 1.015  1.005 - 1.030   pH 6.5  5.0 - 8.0   Glucose, UA NEGATIVE  NEGATIVE mg/dL   Hgb urine dipstick MODERATE (*) NEGATIVE   Bilirubin Urine NEGATIVE  NEGATIVE   Ketones, ur NEGATIVE  NEGATIVE mg/dL   Protein, ur 30 (*) NEGATIVE mg/dL   Urobilinogen, UA 0.2  0.0 - 1.0 mg/dL   Nitrite NEGATIVE  NEGATIVE   Leukocytes, UA SMALL (*) NEGATIVE  URINE MICROSCOPIC-ADD ON     Status: Abnormal   Collection Time    08/04/13  7:50 PM      Result Value Ref Range   Squamous Epithelial / LPF MANY (*) RARE   WBC, UA 21-50  <3 WBC/hpf   RBC / HPF 3-6  <3 RBC/hpf   Bacteria, UA RARE  RARE   Urine-Other AMORPHOUS URATES/PHOSPHATES    CBC     Status: None   Collection Time    08/04/13  8:50 PM      Result Value Ref Range   WBC 6.5  4.0 - 10.5 K/uL   RBC 4.70  3.87 - 5.11 MIL/uL   Hemoglobin 13.9  12.0 - 15.0 g/dL   HCT 08.640.9  57.836.0 - 46.946.0 %   MCV 87.0  78.0 - 100.0 fL   MCH 29.6  26.0 - 34.0 pg   MCHC 34.0  30.0 - 36.0 g/dL   RDW 62.914.7  52.811.5 - 41.315.5 %   Platelets 240  150 - 400 K/uL   ASSESSMENT No diagnosis found.  PLAN Left w/out being seen. UA suspicious for UTI, but appears contaminated. Urine culture sent.  [place] Glorine Hanratty, [place] 08/04/2013  8:39 PM

## 2013-08-05 LAB — URINALYSIS, ROUTINE W REFLEX MICROSCOPIC
BILIRUBIN URINE: NEGATIVE
Glucose, UA: NEGATIVE mg/dL
KETONES UR: NEGATIVE mg/dL
Nitrite: NEGATIVE
PROTEIN: 30 mg/dL — AB
Specific Gravity, Urine: 1.015 (ref 1.005–1.030)
UROBILINOGEN UA: 0.2 mg/dL (ref 0.0–1.0)
pH: 6.5 (ref 5.0–8.0)

## 2013-08-05 LAB — URINE MICROSCOPIC-ADD ON

## 2013-08-05 NOTE — MAU Note (Signed)
Pt not in lobby.  

## 2013-08-05 NOTE — MAU Note (Signed)
Pt not in the lobby 

## 2013-08-06 LAB — URINE CULTURE: SPECIAL REQUESTS: NORMAL

## 2013-08-06 NOTE — MAU Provider Note (Signed)
Attestation of Attending Supervision of Advanced Practitioner (CNM/NP): Evaluation and management procedures were performed by the Advanced Practitioner under my supervision and collaboration.  I have reviewed the Advanced Practitioner's note and chart, and I agree with the management and plan.  Beth Vargas 4:20 PM     

## 2013-08-07 ENCOUNTER — Telehealth: Payer: Self-pay | Admitting: Advanced Practice Midwife

## 2013-08-07 DIAGNOSIS — N390 Urinary tract infection, site not specified: Secondary | ICD-10-CM

## 2013-08-07 MED ORDER — CIPROFLOXACIN HCL 500 MG PO TABS
500.0000 mg | ORAL_TABLET | Freq: Two times a day (BID) | ORAL | Status: DC
Start: 2013-08-07 — End: 2014-01-06

## 2013-08-07 NOTE — Telephone Encounter (Signed)
Called and informed of UTI. Rx sent to pharmacy.

## 2014-01-06 ENCOUNTER — Encounter (HOSPITAL_COMMUNITY): Payer: Self-pay | Admitting: Emergency Medicine

## 2014-01-06 ENCOUNTER — Emergency Department (HOSPITAL_COMMUNITY)
Admission: EM | Admit: 2014-01-06 | Discharge: 2014-01-06 | Disposition: A | Discharge status: Home / Self Care | Payer: Self-pay | Attending: Emergency Medicine | Admitting: Emergency Medicine

## 2014-01-06 DIAGNOSIS — A499 Bacterial infection, unspecified: Secondary | ICD-10-CM | POA: Insufficient documentation

## 2014-01-06 DIAGNOSIS — B9689 Other specified bacterial agents as the cause of diseases classified elsewhere: Secondary | ICD-10-CM | POA: Insufficient documentation

## 2014-01-06 DIAGNOSIS — Z791 Long term (current) use of non-steroidal anti-inflammatories (NSAID): Secondary | ICD-10-CM | POA: Insufficient documentation

## 2014-01-06 DIAGNOSIS — Z792 Long term (current) use of antibiotics: Secondary | ICD-10-CM | POA: Insufficient documentation

## 2014-01-06 DIAGNOSIS — Z87891 Personal history of nicotine dependence: Secondary | ICD-10-CM | POA: Insufficient documentation

## 2014-01-06 DIAGNOSIS — N730 Acute parametritis and pelvic cellulitis: Secondary | ICD-10-CM | POA: Insufficient documentation

## 2014-01-06 DIAGNOSIS — N898 Other specified noninflammatory disorders of vagina: Secondary | ICD-10-CM | POA: Insufficient documentation

## 2014-01-06 DIAGNOSIS — Z79899 Other long term (current) drug therapy: Secondary | ICD-10-CM | POA: Insufficient documentation

## 2014-01-06 DIAGNOSIS — Z3202 Encounter for pregnancy test, result negative: Secondary | ICD-10-CM | POA: Insufficient documentation

## 2014-01-06 DIAGNOSIS — N760 Acute vaginitis: Secondary | ICD-10-CM | POA: Insufficient documentation

## 2014-01-06 LAB — WET PREP, GENITAL
Trich, Wet Prep: NONE SEEN
YEAST WET PREP: NONE SEEN

## 2014-01-06 LAB — POC URINE PREG, ED: PREG TEST UR: NEGATIVE

## 2014-01-06 MED ORDER — AZITHROMYCIN 250 MG PO TABS
1000.0000 mg | ORAL_TABLET | Freq: Once | ORAL | Status: AC
  Administered 2014-01-06: 1000 mg via ORAL
  Filled 2014-01-06: qty 4

## 2014-01-06 MED ORDER — LIDOCAINE HCL (PF) 1 % IJ SOLN
5.0000 mL | Freq: Once | INTRAMUSCULAR | Status: AC
  Administered 2014-01-06: 0.9 mL
  Filled 2014-01-06: qty 5

## 2014-01-06 MED ORDER — DOXYCYCLINE HYCLATE 100 MG PO TABS
100.0000 mg | ORAL_TABLET | Freq: Once | ORAL | Status: AC
  Administered 2014-01-06: 100 mg via ORAL
  Filled 2014-01-06: qty 1

## 2014-01-06 MED ORDER — METRONIDAZOLE 500 MG PO TABS: 500.0000 mg | ORAL_TABLET | Freq: Two times a day (BID) | ORAL | Status: DC

## 2014-01-06 MED ORDER — DOXYCYCLINE HYCLATE 100 MG PO TABS: 100.0000 mg | ORAL_TABLET | Freq: Two times a day (BID) | ORAL | Status: DC

## 2014-01-06 MED ORDER — ONDANSETRON 4 MG PO TBDP
4.0000 mg | ORAL_TABLET | Freq: Once | ORAL | Status: AC
  Administered 2014-01-06: 4 mg via ORAL
  Filled 2014-01-06: qty 1

## 2014-01-06 MED ORDER — CEFTRIAXONE SODIUM 250 MG IJ SOLR
250.0000 mg | Freq: Once | INTRAMUSCULAR | Status: AC
  Administered 2014-01-06: 250 mg via INTRAMUSCULAR
  Filled 2014-01-06: qty 250

## 2014-01-06 NOTE — ED Notes (Signed)
Pt refused to have more blood work done and stated that she needed to go.

## 2014-01-06 NOTE — Discharge Instructions (Signed)
Bacterial Vaginosis °Bacterial vaginosis is a vaginal infection that occurs when the normal balance of bacteria in the vagina is disrupted. It results from an overgrowth of certain bacteria. This is the most common vaginal infection in women of childbearing age. Treatment is important to prevent complications, especially in pregnant women, as it can cause a premature delivery. °CAUSES  °Bacterial vaginosis is caused by an increase in harmful bacteria that are normally present in smaller amounts in the vagina. Several different kinds of bacteria can cause bacterial vaginosis. However, the reason that the condition develops is not fully understood. °RISK FACTORS °Certain activities or behaviors can put you at an increased risk of developing bacterial vaginosis, including: °· Having a new sex partner or multiple sex partners. °· Douching. °· Using an intrauterine device (IUD) for contraception. °Women do not get bacterial vaginosis from toilet seats, bedding, swimming pools, or contact with objects around them. °SIGNS AND SYMPTOMS  °Some women with bacterial vaginosis have no signs or symptoms. Common symptoms include: °· Grey vaginal discharge. °· A fishlike odor with discharge, especially after sexual intercourse. °· Itching or burning of the vagina and vulva. °· Burning or pain with urination. °DIAGNOSIS  °Your health care provider will take a medical history and examine the vagina for signs of bacterial vaginosis. A sample of vaginal fluid may be taken. Your health care provider will look at this sample under a microscope to check for bacteria and abnormal cells. A vaginal pH test may also be done.  °TREATMENT  °Bacterial vaginosis may be treated with antibiotic medicines. These may be given in the form of a pill or a vaginal cream. A second round of antibiotics may be prescribed if the condition comes back after treatment.  °HOME CARE INSTRUCTIONS  °· Only take over-the-counter or prescription medicines as  directed by your health care provider. °· If antibiotic medicine was prescribed, take it as directed. Make sure you finish it even if you start to feel better. °· Do not have sex until treatment is completed. °· Tell all sexual partners that you have a vaginal infection. They should see their health care provider and be treated if they have problems, such as a mild rash or itching. °· Practice safe sex by using condoms and only having one sex partner. °SEEK MEDICAL CARE IF:  °· Your symptoms are not improving after 3 days of treatment. °· You have increased discharge or pain. °· You have a fever. °MAKE SURE YOU:  °· Understand these instructions. °· Will watch your condition. °· Will get help right away if you are not doing well or get worse. °FOR MORE INFORMATION  °Centers for Disease Control and Prevention, Division of STD Prevention: www.cdc.gov/std °American Sexual Health Association (ASHA): www.ashastd.org  °Document Released: 03/26/2005 Document Revised: 01/14/2013 Document Reviewed: 11/05/2012 °ExitCare® Patient Information ©2015 ExitCare, LLC. This information is not intended to replace advice given to you by your health care provider. Make sure you discuss any questions you have with your health care provider. ° °Pelvic Inflammatory Disease °Pelvic inflammatory disease (PID) refers to an infection in some or all of the female organs. The infection can be in the uterus, ovaries, fallopian tubes, or the surrounding tissues in the pelvis. PID can cause abdominal or pelvic pain that comes on suddenly (acute pelvic pain). PID is a serious infection because it can lead to lasting (chronic) pelvic pain or the inability to have children (infertile).  °CAUSES  °The infection is often caused by the normal bacteria found   in the vaginal tissues. PID may also be caused by an infection that is spread during sexual contact. PID can also occur following:   The birth of a baby.   A miscarriage.   An abortion.    Major pelvic surgery.   The use of an intrauterine device (IUD).   A sexual assault.  RISK FACTORS Certain factors can put a person at higher risk for PID, such as:  Being younger than 25 years.  Being sexually active at [place] age.  Usingnonbarrier contraception.  Havingmultiple sexual partners.  Having sex with someone who has symptoms of a genital infection.  Using oral contraception. Other times, certain behaviors can increase the possibility of getting PID, such as:  Having sex during your period.  Using a vaginal douche.  Having an intrauterine device (IUD) in place. SYMPTOMS   Abdominal or pelvic pain.   Fever.   Chills.   Abnormal vaginal discharge.  Abnormal uterine bleeding.   Unusual pain shortly after finishing your period. DIAGNOSIS  Your caregiver will choose some of the following methods to make a diagnosis, such as:   Performinga physical exam and history. A pelvic exam typically reveals a very tender uterus and surrounding pelvis.   Ordering laboratory tests including a pregnancy test, blood tests, and urine test.  Orderingcultures of the vagina and cervix to check for a sexually transmitted infection (STI).  Performing an ultrasound.   Performing a laparoscopic procedure to look inside the pelvis.  TREATMENT   Antibiotic medicines may be prescribed and taken by mouth.   Sexual partners may be treated when the infection is caused by a sexually transmitted disease (STD).   Hospitalization may be needed to give antibiotics intravenously.  Surgery may be needed, but this is rare. It may take weeks until you are completely well. If you are diagnosed with PID, you should also be checked for human immunodeficiency virus (HIV). HOME CARE INSTRUCTIONS   If given, take your antibiotics as directed. Finish the medicine even if you start to feel better.   Only take over-the-counter or prescription medicines for pain,  discomfort, or fever as directed by your caregiver.   Do not have sexual intercourse until treatment is completed or as directed by your caregiver. If PID is confirmed, your recent sexual partner(s) will need treatment.   Keep your follow-up appointments. SEEK MEDICAL CARE IF:   You have increased or abnormal vaginal discharge.   You need prescription medicine for your pain.   You vomit.   You cannot take your medicines.   Your partner has an STD.  SEEK IMMEDIATE MEDICAL CARE IF:   You have a fever.   You have increased abdominal or pelvic pain.   You have chills.   You have pain when you urinate.   You are not better after 72 hours following treatment.  MAKE SURE YOU:   Understand these instructions.  Will watch your condition.  Will get help right away if you are not doing well or get worse. Document Released: 03/26/2005 Document Revised: 07/21/2012 Document Reviewed: 03/22/2011 [facility] Patient Information 2015 [place]Care, [place]LL[place]. This information is not intended to replace advice given to you by your health care provider. Make sure you discuss any questions you have with your health care provider.   Emergency Department Resource Guide 1) Find a Doctor and Pay Out of Pocket Although you won't have to find out who is covered by your insurance plan, it is a good idea to ask around and get  recommendations. You will then need to call the office and see if the doctor you have chosen will accept you as a new patient and what types of options they offer for patients who are self-pay. Some doctors offer discounts or will set up payment plans for their patients who do not have insurance, but you will need to ask so you aren't surprised when you get to your appointment.  2) Contact Your Local Health Department Not all health departments have doctors that can see patients for sick visits, but many do, so it is worth a call to see if yours does. If you don't know where your  local health department is, you can check in your phone book. The CDC also has a tool to help you locate your state's health department, and many state websites also have listings of all of their local health departments.  3) Find a Walk-in Clinic If your illness is not likely to be very severe or complicated, you may want to try a walk in clinic. These are popping up all over the country in pharmacies, drugstores, and shopping centers. They're usually staffed by nurse practitioners or physician assistants that have been trained to treat common illnesses and complaints. They're usually fairly quick and inexpensive. However, if you have serious medical issues or chronic medical problems, these are probably not your best option.  No Primary Care Doctor: - Call Health Connect at  [phone] - they can help you locate a primary care doctor that  accepts your insurance, provides certain services, etc. - Physician Referral Service- [phone]  Chronic Pain Problems: Organization         Address  Phone   Notes  [facility]  [phone] Patients need to be referred by their primary care doctor.   Medication Assistance: Organization         Address  Phone   Notes  [facility] Medication [facility] [address] [place] [place], [place] [address] [phone] --Must be a resident of [facility] -- Must have NO insurance coverage whatsoever (no Medicaid/ Medicare, etc.) -- The pt. MUST have a primary care doctor that directs their care regularly and follows them in the community   [facility]ist  [phone]   [employer]  [phone]    Agencies that provide inexpensive medical care: Organization         Address  Phone   Notes  [facility]  [phone]   [facility]    [phone]   [facility] [address] [place], [place] [address] [phone]   [facility] [address] [place]  [address], [place] [phone]   Planned Parenthood    [phone]   [facility]    [phone]   [facility]  [address], [place] Phone:  [phone], Fax:  [phone] Hours of Operation:  9 am - 6 pm, M-F.  Also accepts Medicaid/Medicare and self-pay.  [facility]  [address], [place] Phone: [phone], Fax: [phone]. Hours of Operation:  8:30 am - 5:30 pm, M-F.  Also accepts Medicaid and self-pay.  [facility] [place] [address], [place] Phone: [phone]   [facility] [address] Natasha Bence [place], [place] [phone], Ext. [phone] Mondays & Thursdays: 7-9 AM.  First 15 patients are seen on a first  come, first serve basis.    Medicaid-accepting [facility] Providers:  Organization         Address  Phone   Notes  [facility] [address] [place] [address], [place] [phone] Also accepts self-pay patients.  [facility] [address] Laurell Josephs [place], [place]  [phone]   [facility] [address], [place] [phone]   [facility] [address] [place] [phone]   Renaye Rakers [address], [place]   [phone] Only accepts [place] Access [place] patients after they have their name applied to their card.   Self-Pay (no insurance) in [facility]:  Organization         Address  Phone   Notes  Sickle Cell Patients, [facility] [address] [place], [place] [phone]   [facility] [address] [place], [place] [phone]   [facility] [place]  [address], [place] [phone]   [facility]/Dr. Osei-Bonsu  [address], [place] or [address], [place] [phone] Phone number for both [place] locations is the same.  [facility] [address], [place] [phone]   [facility] [address], [place] or [address] [phone]   [facility] [address], [place] [phone], phone; [phone], fax Sees patients 1st and 3rd Saturday of every month.  Must not qualify for public or private insurance (i.e. Medicaid, Medicare, [place] Health Choice, Veterans' Benefits)  Household income should be no more than 200% of the poverty level The clinic cannot treat you if you are pregnant or think you are pregnant  Sexually transmitted diseases are not treated at the clinic.    Dental Care: Organization         Address  Phone  Notes  [facility] Department of [facility] [address] [place], [place] [phone] Accepts children up to age 23 who are enrolled in [place] or [place] Health Choice; pregnant women with a Medicaid card; and children who have applied for Medicaid or [place] Health Choice, but were declined, whose parents can pay a reduced fee at time of service.  [facility] Department of [facility]  [address], [place] [phone] Accepts children up to age 34 who are enrolled in [place] or [place] Health Choice; pregnant women with a Medicaid card; and children who have applied for Medicaid or [place] Health Choice, but were declined, whose parents can pay a reduced fee at time of service.  [facility]  [address] [place], [place] [phone] Patients are seen by appointment only. Walk-ins are not accepted. [facility] will see patients 36 years of age and older. Monday - Tuesday (8am-5pm) Most Wednesdays (8:30-5pm) $30 per visit, cash only  [facility] Adult Dental Access PROGRAM  [address], [facility] [phone] Patients are seen by appointment only. Walk-ins are not  accepted. [facility] will see patients 45 years of age and older. One Wednesday Evening (Monthly: Volunteer Based).  $30 per visit, cash only  [employer]  [phone] for adults; Children under age 86, call Graduate Pediatric Dentistry at [phone]. Children aged 75-14,  please call [phone] to request a pediatric application.  Dental services are provided in all areas of dental care including fillings, crowns and bridges, complete and partial dentures, implants, gum treatment, root canals, and extractions. Preventive care is also provided. Treatment is provided to both adults and children. Patients are selected via a lottery and there is often a waiting list.   [facility] [address] [place]  [phone] [email or web address]   [facility] [address] [place], [place] [phone], Ext. [phone] Second and Fourth Thursday of each month, opens at 6:30 AM; Clinic ends at 9 AM.  Patients are seen on a first-come first-served basis, and a limited number are seen during each clinic.   [facility]  [address] Ether Griffins [place], [place] [phone]   Eligibility Requirements You must have lived in [place], [place], or [place] counties for at least the last three months.   You cannot be eligible for state or federal sponsored [place], including [employer], [place], or [employer].   You generally cannot be eligible for healthcare insurance through your employer.    How to apply: Eligibility screenings are held every Tuesday and Wednesday afternoon from 1:00 pm until 4:00 pm. You do not need an appointment for the interview!  [facility] [address] [place], [place] [phone]   [facility] Health Department  [phone]   [facility] Health Department  [phone]   [facility]  [phone]    Behavioral Health Resources in the  Community: Intensive Outpatient Programs Organization         Address  Phone  Notes  [facility] [address], [place], [place] [phone]   [facility] [address], [place], [place] [phone]   [facility] [address], [place], [place]  [phone]   [facility] [address],  [place], [place] [phone] or [phone]   Substance Abuse Resources Organization         Address  Phone  Notes  [facility]  [phone]   [facility]  [phone]   [facility] [place]  [phone]   Floydene Flock  [phone]   Residential & Outpatient Substance Abuse Program  [phone]   Psychological Services Organization         Address  Phone  Notes  [facility] Health  [phone]   [facility] Services  [phone]   [facility] Mental Health [address], [place] [phone] or [phone]    Mobile Crisis Teams Organization         Address  Phone  Notes  [facility], Mobile Crisis Care Unit  [phone]   [facility]  [address] [place], [place] [phone]   Doristine Locks [address] [place] [address] [place] [phone]    Self-Help/Support Groups Organization         Address  Phone             Notes  [facility] [place] - variety of support groups  336- I7437963 Call for more information  Narcotics Anonymous (NA), [facility] [address], [employer] [place]  2 meetings at this location   Statistician         Address  Phone  Notes  [facility] [address],    [place]  [phone]   [facility]  [address] [place] [address], [place], [place] [phone]   [facility] [address] [place], [place] [phone] Admissions: 8am-3pm M-F  [facility] [address]    [place], [place] [phone]   [facility] [address] [place], [place], [place] [phone]   [facility] [address]  [place], [place] [phone]   [facility] - Intensive Outpatient [address] Laurell Josephs [address], [place], [place] [phone]   [facility] (Addiction Recovery [facility] [address] [place]  [place], [place] [phone] or [phone]   [facility] ([facility]) [address], [place], [place] [phone] Accepts Medicaid  [facility] [address],  [place] [phone] Substance Abuse/Addiction Treatment   [facility] Organization         Address  Phone  Notes  [facility]  [phone]   Angie Fava, PhD [address] Ervin Knack [place], [place]   [phone] or [phone]   [facility] Behavioral   [address] [place], [place] [phone]   [facility] [address] [place], [place] [phone] Insurance/Medicaid/sponsorship through [facility] [address]                                    [place], [place] [phone] Therapy/tele-psych/case  [facility] [address], [place] [phone]    Dr. Lolly Mustache  [phone]   [facility]  [employer] [facility] Dept. 1) [address],  2) [address], [place] 3)  [address], [place] [phone]  [phone]   [facility] Child Abuse Hotline (909)[phone] or [phone] (After Hours)

## 2014-01-06 NOTE — ED Provider Notes (Signed)
CSN: [id number]     Arrival date & time 01/06/14  6440 History   First MD Initiated Contact with Patient 01/06/14 1014     Chief Complaint  Patient presents with  . Vaginal Discharge     (Consider location/radiation/quality/duration/timing/severity/associated sxs/prior Treatment) Patient is a 23 y.o. female presenting with vaginal discharge. The history is provided by the patient.  Vaginal Discharge Quality:  Green Severity:  Moderate Onset quality:  Gradual Duration:  1 week Timing:  Constant Progression:  Worsening Chronicity:  Recurrent Context: spontaneously   Relieved by:  Nothing Worsened by:  Nothing tried Associated symptoms: no fever, no genital lesions, no urinary frequency, no urinary hesitancy, no urinary incontinence, no vaginal itching and no vomiting     Past Medical History  Diagnosis Date  . No pertinent past medical history    Past Surgical History  Procedure Laterality Date  . No past surgeries     Family History  Problem Relation Age of Onset  . Hypertension Mother    History  Substance Use Topics  . Smoking status: Former Smoker -- 0.25 packs/day    Quit date: 10/21/2011  . Smokeless tobacco: Never Used  . Alcohol Use: No   OB History   Grav Para Term Preterm Abortions TAB SAB Ect Mult Living   1 1 1  0 0 0 0 0 0 1     Review of Systems  Constitutional: Negative for fever.  Gastrointestinal: Negative for vomiting.  Genitourinary: Positive for vaginal discharge. Negative for bladder incontinence and hesitancy.  All other systems reviewed and are negative.     Allergies  Review of patient's allergies indicates no known allergies.  Home Medications   Prior to Admission medications   Medication Sig Start Date End Date Taking? Authorizing Provider  ciprofloxacin (CIPRO) 500 MG tablet Take 1 tablet (500 mg total) by mouth 2 (two) times daily. 08/07/13   Dorathy Kinsman, CNM  ibuprofen (ADVIL,MOTRIN) 600 MG tablet Take 1 tablet (600 mg  total) by mouth every 6 (six) hours as needed for pain. 03/29/12   Brock Bad, MD  oxyCODONE-acetaminophen (PERCOCET/ROXICET) 5-325 MG per tablet Take 1-2 tablets by mouth every 4 (four) hours as needed (moderate - severe pain). 03/29/12   Brock Bad, MD  Prenatal Vit-Fe Fumarate-FA (PRENATAL MULTIVITAMIN) TABS Take 1 tablet by mouth daily.    Historical Provider, MD   BP 120/101  Pulse 83  Temp(Src) 98.6 F (37 C) (Oral)  Resp 20  SpO2 100%  LMP 12/24/2013 Physical Exam  Nursing note and vitals reviewed. Constitutional: She is oriented to person, place, and time. She appears well-developed and well-nourished. No distress.  HENT:  Head: Normocephalic and atraumatic.  Mouth/Throat: Oropharynx is clear and moist.  Eyes: EOM are normal. Pupils are equal, round, and reactive to light.  Neck: Normal range of motion. Neck supple.  Cardiovascular: Normal rate and regular rhythm.  Exam reveals no friction rub.   No murmur heard. Pulmonary/Chest: Effort normal and breath sounds normal. No respiratory distress. She has no wheezes. She has no rales.  Abdominal: Soft. She exhibits no distension. There is tenderness (very mild). There is no rebound.  Genitourinary: There is no rash, tenderness or lesion on the right labia. There is no rash, tenderness or lesion on the left labia. No foreign body around the vagina. Vaginal discharge (mild foul smelling discharge, yellow) found.  Musculoskeletal: Normal range of motion. She exhibits no edema.  Neurological: She is alert and oriented to person, place, and  time.  Skin: She is not diaphoretic.    ED Course  Procedures (including critical care time) Labs Review Labs Reviewed  GC/CHLAMYDIA PROBE AMP  WET PREP, GENITAL  RPR  HIV ANTIBODY (ROUTINE TESTING)  URINALYSIS, ROUTINE W REFLEX MICROSCOPIC  POC URINE PREG, ED    Imaging Review No results found.   EKG Interpretation None      MDM   Final diagnoses:  PID (acute pelvic  inflammatory disease)  BV (bacterial vaginosis)    61F here with concern for STDs. Hx of GC/Ch before. Moderate discharge for a week now. I offered syphilis and HIV testing, which is she amenable to. Mild suprapubic tenderness on exam. GU exam shows mild adnexal tenderness, no masses or fullness. No CMT. Will treat for PID. Patient refused RPR and HIV since taking too long to get the blood. Treated for BV and PID.  Elwin MochaBlair Wayland Baik, MD 01/06/14 1415

## 2014-01-06 NOTE — ED Notes (Signed)
Per pt sts white discharge with odor. sts unusual period this month.

## 2014-01-07 LAB — GC/CHLAMYDIA PROBE AMP
CT Probe RNA: POSITIVE — AB
GC Probe RNA: POSITIVE — AB

## 2014-01-08 ENCOUNTER — Telehealth (HOSPITAL_COMMUNITY): Payer: Self-pay

## 2014-02-08 ENCOUNTER — Encounter (HOSPITAL_COMMUNITY): Payer: Self-pay | Admitting: Emergency Medicine

## 2014-02-21 ENCOUNTER — Emergency Department (HOSPITAL_COMMUNITY)
Admission: EM | Admit: 2014-02-21 | Discharge: 2014-02-21 | Disposition: A | Discharge status: Home / Self Care | Payer: Self-pay | Attending: Emergency Medicine | Admitting: Emergency Medicine

## 2014-02-21 ENCOUNTER — Encounter (HOSPITAL_COMMUNITY): Payer: Self-pay | Admitting: Nurse Practitioner

## 2014-02-21 DIAGNOSIS — A6009 Herpesviral infection of other urogenital tract: Secondary | ICD-10-CM | POA: Insufficient documentation

## 2014-02-21 DIAGNOSIS — A6000 Herpesviral infection of urogenital system, unspecified: Secondary | ICD-10-CM

## 2014-02-21 DIAGNOSIS — N390 Urinary tract infection, site not specified: Secondary | ICD-10-CM | POA: Insufficient documentation

## 2014-02-21 DIAGNOSIS — Z720 Tobacco use: Secondary | ICD-10-CM | POA: Insufficient documentation

## 2014-02-21 DIAGNOSIS — Z3202 Encounter for pregnancy test, result negative: Secondary | ICD-10-CM | POA: Insufficient documentation

## 2014-02-21 LAB — URINALYSIS, ROUTINE W REFLEX MICROSCOPIC
BILIRUBIN URINE: NEGATIVE
Glucose, UA: NEGATIVE mg/dL
Ketones, ur: NEGATIVE mg/dL
NITRITE: POSITIVE — AB
Protein, ur: >300 mg/dL — AB
SPECIFIC GRAVITY, URINE: 1.026 (ref 1.005–1.030)
UROBILINOGEN UA: 0.2 mg/dL (ref 0.0–1.0)
pH: 6 (ref 5.0–8.0)

## 2014-02-21 LAB — URINE MICROSCOPIC-ADD ON

## 2014-02-21 LAB — HIV ANTIBODY (ROUTINE TESTING W REFLEX): HIV 1&2 Ab, 4th Generation: NONREACTIVE

## 2014-02-21 LAB — RPR

## 2014-02-21 LAB — POC URINE PREG, ED: Preg Test, Ur: NEGATIVE

## 2014-02-21 MED ORDER — HYDROCODONE-ACETAMINOPHEN 5-325 MG PO TABS: 1.0000 | ORAL_TABLET | Freq: Four times a day (QID) | ORAL | Status: DC | PRN

## 2014-02-21 MED ORDER — CEPHALEXIN 500 MG PO CAPS: 500.0000 mg | ORAL_CAPSULE | Freq: Two times a day (BID) | ORAL | Status: DC

## 2014-02-21 MED ORDER — AZITHROMYCIN 1 G PO PACK
1.0000 g | PACK | Freq: Once | ORAL | Status: AC
  Administered 2014-02-21: 1 g via ORAL
  Filled 2014-02-21: qty 1

## 2014-02-21 MED ORDER — CEFTRIAXONE SODIUM 250 MG IJ SOLR
250.0000 mg | Freq: Once | INTRAMUSCULAR | Status: AC
  Administered 2014-02-21: 250 mg via INTRAMUSCULAR
  Filled 2014-02-21: qty 250

## 2014-02-21 MED ORDER — ACYCLOVIR 400 MG PO TABS: 400.0000 mg | ORAL_TABLET | Freq: Three times a day (TID) | ORAL | Status: AC

## 2014-02-21 NOTE — ED Provider Notes (Signed)
CSN: [id number]     Arrival date & time 02/21/14  1117 History   First MD Initiated Contact with Patient 02/21/14 1138     Chief Complaint  Patient presents with  . Dysuria      Patient is a 23 y.o. female presenting with dysuria. The history is provided by the patient.  Dysuria [name] presents for dysuria for the last 4 days. She reports increased urgency and frequency with burning on urination. She had a small amount of blood after she urinated 4 days ago, but then she started her cycle. She denies fevers, nausea, vomiting. She has had irregular cycles for the last 3 months. She has vaginal discharge and does reports having a new sexual partner. No significant abdominal pain.symptoms are mild, intermittent, and worsening.    Past Medical History  Diagnosis Date  . No pertinent past medical history    Past Surgical History  Procedure Laterality Date  . No past surgeries     Family History  Problem Relation Age of Onset  . Hypertension Mother    History  Substance Use Topics  . Smoking status: Current Every Day Smoker -- 0.25 packs/day  . Smokeless tobacco: Never Used  . Alcohol Use: Yes   OB History    Gravida Para Term Preterm AB TAB SAB Ectopic Multiple Living   1 1 1  0 0 0 0 0 0 1     Review of Systems  Genitourinary: Positive for dysuria.  All other systems reviewed and are negative.     Allergies  Review of patient's allergies indicates no known allergies.  Home Medications   Prior to Admission medications   Medication Sig Start Date End Date Taking? Authorizing Provider  doxycycline (VIBRA-TABS) 100 MG tablet Take 1 tablet (100 mg total) by mouth 2 (two) times daily. Patient not taking: Reported on 02/21/2014 01/06/14   Elwin MochaBlair Walden, MD  metroNIDAZOLE (FLAGYL) 500 MG tablet Take 1 tablet (500 mg total) by mouth 2 (two) times daily. Patient not taking: Reported on 02/21/2014 01/06/14   Elwin MochaBlair Walden, MD   BP 112/67 mmHg  Pulse 88  Temp(Src) 98.1 F  (36.7 C) (Oral)  Resp 16  Ht 5\' 4"  (1.626 m)  Wt 109 lb 8 oz (49.669 kg)  BMI 18.79 kg/m2  SpO2 98% Physical Exam  Constitutional: She is oriented to person, place, and time. She appears well-developed and well-nourished.  HENT:  Head: Normocephalic and atraumatic.  Cardiovascular: Normal rate and regular rhythm.   No murmur heard. Pulmonary/Chest: Effort normal and breath sounds normal. No respiratory distress.  Abdominal: Soft. There is no tenderness. There is no rebound and no guarding.  Genitourinary:  Patient refuses speculum and digital pelvic exam. External exam with small amount of watery discharge and multiple labial ulcerations without significant surrounding edema.  Musculoskeletal: She exhibits no edema or tenderness.  Neurological: She is alert and oriented to person, place, and time.  Skin: Skin is warm and dry.  Psychiatric: She has a normal mood and affect. Her behavior is normal.  Nursing note and vitals reviewed.   ED Course  Procedures (including critical care time) Labs Review Labs Reviewed  URINALYSIS, ROUTINE W REFLEX MICROSCOPIC - Abnormal; Notable for the following:    APPearance CLOUDY (*)    Hgb urine dipstick SMALL (*)    Protein, ur >300 (*)    Nitrite POSITIVE (*)    Leukocytes, UA SMALL (*)    All other components within normal limits  URINE MICROSCOPIC-ADD ON -  Abnormal; Notable for the following:    Squamous Epithelial / LPF FEW (*)    Bacteria, UA FEW (*)    All other components within normal limits  GC/CHLAMYDIA PROBE AMP  WET PREP, GENITAL  RPR  HIV ANTIBODY (ROUTINE TESTING)  POC URINE PREG, ED    Imaging Review No results found.   EKG Interpretation None      MDM   Final diagnoses:  Herpes genitalis  UTI (lower urinary tract infection)    Patient here with dysuria and vaginal pain and discharge, and physical exam consistent with herpes genitalis. Patient refuses bimanual and speculum pelvic exam due to discomfort  discussed with patient and risks of missed PID diagnosis patient acknowledges. Will treat for possible cervicitis. Pelvic cultures were from external discharge. Discussed home care and return precautions. Clinical picture not consistent with tubo-ovarian abscess, renal colic, or pyelonephritis.    Tilden FossaElizabeth Jasson Siegmann, MD 02/21/14 [phone]

## 2014-02-21 NOTE — ED Notes (Addendum)
Pelvic cart ready @ bedside. Pt fully undressed waist down. 

## 2014-02-21 NOTE — ED Notes (Signed)
She reports hematuria, dysuria, and "knots in my breast and pelvis" x 5 days.

## 2014-02-21 NOTE — Discharge Instructions (Signed)
Genital Herpes °Genital herpes is a sexually transmitted disease. This means that it is a disease passed by having sex with an infected person. There is no cure for genital herpes. The time between attacks can be months to years. The virus may live in a person but produce no problems (symptoms). This infection can be passed to a baby as it travels down the birth canal (vagina). In a newborn, this can cause central nervous system damage, eye damage, or even death. The virus that causes genital herpes is usually HSV-2 virus. The virus that causes oral herpes is usually HSV-1. The diagnosis (learning what is wrong) is made through culture results. °SYMPTOMS  °Usually symptoms of pain and itching begin a few days to a week after contact. It first appears as small blisters that progress to small painful ulcers which then scab over and heal after several days. It affects the outer genitalia, birth canal, cervix, penis, anal area, buttocks, and thighs. °HOME CARE INSTRUCTIONS  °· Keep ulcerated areas dry and clean. °· Take medications as directed. Antiviral medications can speed up healing. They will not prevent recurrences or cure this infection. These medications can also be taken for suppression if there are frequent recurrences. °· While the infection is active, it is contagious. Avoid all sexual contact during active infections. °· Condoms may help prevent spread of the herpes virus. °· Practice safe sex. °· Wash your hands thoroughly after touching the genital area. °· Avoid touching your eyes after touching your genital area. °· Inform your caregiver if you have had genital herpes and become pregnant. It is your responsibility to insure a safe outcome for your baby in this pregnancy. °· Only take over-the-counter or prescription medicines for pain, discomfort, or fever as directed by your caregiver. °SEEK MEDICAL CARE IF:  °· You have a recurrence of this infection. °· You do not respond to medications and are not  improving. °· You have new sources of pain or discharge which have changed from the original infection. °· You have an oral temperature above 102° F (38.9° C). °· You develop abdominal pain. °· You develop eye pain or signs of eye infection. °Document Released: 03/23/2000 Document Revised: 06/18/2011 Document Reviewed: 04/13/2009 °ExitCare® Patient Information ©2015 [employer]. This information is not intended to replace advice given to you by your health care provider. Make sure you discuss any questions you have with your health care provider. ° °

## 2014-02-22 LAB — GC/CHLAMYDIA PROBE AMP
CT Probe RNA: NEGATIVE
GC Probe RNA: NEGATIVE

## 2014-06-18 ENCOUNTER — Inpatient Hospital Stay (HOSPITAL_COMMUNITY)
Admission: AD | Admit: 2014-06-18 | Discharge: 2014-06-18 | Disposition: A | Discharge status: Home / Self Care | Payer: Self-pay | Source: Ambulatory Visit | Attending: Family Medicine | Admitting: Family Medicine

## 2014-06-18 ENCOUNTER — Encounter (HOSPITAL_COMMUNITY): Payer: Self-pay | Admitting: *Deleted

## 2014-06-18 DIAGNOSIS — F17200 Nicotine dependence, unspecified, uncomplicated: Secondary | ICD-10-CM | POA: Insufficient documentation

## 2014-06-18 DIAGNOSIS — R109 Unspecified abdominal pain: Secondary | ICD-10-CM | POA: Insufficient documentation

## 2014-06-18 DIAGNOSIS — Z3202 Encounter for pregnancy test, result negative: Secondary | ICD-10-CM | POA: Insufficient documentation

## 2014-06-18 HISTORY — DX: Other specified health status: Z78.9

## 2014-06-18 LAB — URINALYSIS, ROUTINE W REFLEX MICROSCOPIC
BILIRUBIN URINE: NEGATIVE
Glucose, UA: NEGATIVE mg/dL
KETONES UR: NEGATIVE mg/dL
Nitrite: POSITIVE — AB
Protein, ur: 100 mg/dL — AB
SPECIFIC GRAVITY, URINE: 1.025 (ref 1.005–1.030)
UROBILINOGEN UA: 1 mg/dL (ref 0.0–1.0)
pH: 7 (ref 5.0–8.0)

## 2014-06-18 LAB — URINE MICROSCOPIC-ADD ON

## 2014-06-18 LAB — POCT PREGNANCY, URINE: PREG TEST UR: NEGATIVE

## 2014-06-18 NOTE — Discharge Instructions (Signed)
Pregnancy Tests HOW DO PREGNANCY TESTS WORK? All pregnancy tests look for a special hormone in the urine or blood that is only present in pregnant women. This hormone, human chorionic gonadotropin (hCG), is also called the pregnancy hormone.  WHAT IS THE DIFFERENCE BETWEEN A URINE AND A BLOOD PREGNANCY TEST? IS ONE BETTER THAN THE OTHER? There are two types of pregnancy tests.  Blood tests.  Urine tests. Both tests look for the presence of hCG, the pregnancy hormone. Many women use a urine test or home pregnancy test (HPT) to find out if they are pregnant. HPTs are cheap, easy to use, can be done at home, and are private. When a woman has a positive result on an HPT, she needs to see her caregiver right away. The caregiver can confirm a positive HPT result with another urine test, a blood test, ultrasound, and a pelvic exam.  There are two types of blood tests you can get from a caregiver.   A quantitative blood test (or the beta hCG test). This test measures the exact amount of hCG in the blood. This means it can pick up very small amounts of hCG, making it a very accurate test.  A qualitative hCG blood test. This test gives a simple yes or no answer to whether you are pregnant. This test is more like a urine test in terms of its accuracy. Blood tests can pick up hCG earlier in a pregnancy than urine tests can. Blood tests can tell if you are pregnant about 6 to 8 days after you release an egg from an ovary (ovulate). Urine tests can determine pregnancy about 2 weeks after ovulation.  HOW IS A HOME PREGNANCY TEST DONE?  There are many types of home pregnancy tests or HPTs that can be bought over-the-counter at drug or discount stores.   Some involve collecting your urine in a cup and dipping a stick into the urine or putting some of the urine into a special container with an eyedropper.  Others are done by placing a stick into your urine stream.  Tests vary in how long you need to wait for  the stick or container to turn a certain color or have a symbol on it (like a plus or a minus).  All tests come with written instructions. Most tests also have toll-free phone numbers to call if you have any questions about how to do the test or read the results. HOW ACCURATE ARE HOME PREGNANCY TESTS?  HPTs are very accurate. Most brands of HPTs say they are 97% to 99% accurate when taken 1 week after missing your menstrual period, but this can vary with actual use. Each brand varies in how sensitive it is in picking up the pregnancy hormone hCG. If a test is not done correctly, it will be less accurate. Always check the package to make sure it is not past its expiration date. If it is, it will not be accurate. Most brands of HPTs tell users to do the test again in a few days, no matter what the results.  If you use an HPT too early in your pregnancy, you may not have enough of the pregnancy hormone hCG in your urine to have a positive test result. Most HPTs will be accurate if you test yourself around the time your period is due (about 2 weeks after you ovulate). You can get a negative test result if you are not pregnant or if you ovulated later than you thought you did.  You may also have problems with the pregnancy, which affects the amount of hCG you have in your urine. If your HPT is negative, test yourself again within a few days to 1 week. If you keep getting a negative result and think you are pregnant, talk with your caregiver right away about getting a blood pregnancy test.  FALSE POSITIVE PREGNANCY TEST A false positive HPT can happen if there is blood or protein present in your urine. A false positive can also happen if you were recently pregnant or if you take a pregnancy test too soon after taking fertility drug that contains hCG. Also, some prescription medicines such as water pills (diuretics), tranquilizers, seizure medicines, psychiatric medicines, and allergy and nausea medicines  (promethazine) give false positive readings. FALSE NEGATIVE PREGNANCY TEST  A false negative HPT can happen if you do the test too early. Try to wait until you are at least 1 day late for your menstrual period.  It may happen if you wait too long to test the urine (longer than 15 minutes).  It may also happen if the urine is too diluted because you drank a lot of fluids before getting the urine sample. It is best to test the first morning urine after you get out of bed. If your menstrual period did not start after a week of a negative HPT, repeat the pregnancy test. CAN ANYTHING INTERFERE WITH HOME PREGNANCY TEST RESULTS?  Most medicines, both over-the-counter and prescription drugs, including birth control pills and antibiotics, should not affect the results of a HPT. Only those drugs that have the pregnancy hormone hCG in them can give a false positive test result. Drugs that have hCG in them may be used for treating infertility (not being able to get pregnant). Alcohol and illegal drugs do not affect HPT results, but you should not be using these substances if you are trying to get pregnant. If you have a positive pregnancy test, call your caregiver to make an appointment to begin prenatal care. Document Released: 03/29/2003 Document Revised: 06/18/2011 Document Reviewed: 07/10/2013 [facility] Patient Information 2015 [place], [place]. This information is not intended to replace advice given to you by your health care provider. Make sure you discuss any questions you have with your health care provider.   Safe Over the Counter Medications in Pregnancy   Acne:  Benzoyl Peroxide  Salicylic Acid   Backache/Headache:  Tylenol: 2 regular strength every 4 hours OR        2 Extra strength every 6 hours   Colds/Coughs/Allergies:  Benadryl (alcohol free) 25 mg every 6 hours as needed  Breath right strips  Claritin  Cepacol throat lozenges  Chloraseptic throat spray  Cold-Eeze- up to  three times per day  Cough drops, alcohol free  Flonase (by prescription only)  Guaifenesin  Mucinex  Robitussin DM (plain only, alcohol free)  Saline nasal spray/drops  Sudafed (pseudoephedrine) & Actifed * use only after [redacted] weeks gestation and if you do not have high blood pressure  Tylenol  Vicks Vaporub  Zinc lozenges  Zyrtec   Constipation:  Colace  Ducolax suppositories  Fleet enema  Glycerin suppositories  Metamucil  Milk of magnesia  Miralax  Senokot  Smooth move tea   Diarrhea:  Kaopectate  Imodium A-D   *NO pepto Bismol   Hemorrhoids:  Anusol  Anusol HC  Preparation H  Tucks   Indigestion:  Tums  Maalox  Mylanta  Zantac  Pepcid   Insomnia:  Benadryl (alcohol free) 25mg   every 6 hours as needed  Tylenol PM  Unisom, no Gelcaps   Leg Cramps:  Tums  MagGel   Nausea/Vomiting:  Bonine  Dramamine  Emetrol  Ginger extract  Sea bands  Meclizine  Nausea medication to take during pregnancy:  Unisom (doxylamine succinate 25 mg tablets) Take one tablet daily at bedtime. If symptoms are not adequately controlled, the dose can be increased to a maximum recommended dose of two tablets daily (1/2 tablet in the morning, 1/2 tablet mid-afternoon and one at bedtime).  Vitamin B6  tablets. Take one tablet twice a day (up to 200 mg per day).   Skin Rashes:  Aveeno products  Benadryl cream or  every 6 hours as needed  Calamine Lotion  1% cortisone cream   Yeast infection:  Gyne-lotrimin 7  Monistat 7    **If taking multiple medications, please check labels to avoid duplicating the same active ingredients  **take medication as directed on the label  ** Do not exceed 4000 mg of tylenol in 24 hours  **Do not take medications that contain aspirin or ibuprofen

## 2014-06-18 NOTE — MAU Provider Note (Signed)
  History     CSN: [id number]  Arrival date and time: 06/18/14 1514   First Provider Initiated Contact with Patient 06/18/14 1605      Chief Complaint  Patient presents with  . Possible Pregnancy  . Headache   HPI Beth Vargas 24 y.o. G1P1001 female presents complaining of headache with nausea and vomiting yesterday.  She reports feeling improved with no headache or vomiting today.  She thinks she may be pregnant.  She does not desire medication to treat nausea.  She denies fever, weakness, SOB, chest pain.   OB History    Gravida Para Term Preterm AB TAB SAB Ectopic Multiple Living   1 1 1  0 0 0 0 0 0 1      Past Medical History  Diagnosis Date  . No pertinent past medical history   . Medical history non-contributory     Past Surgical History  Procedure Laterality Date  . No past surgeries      Family History  Problem Relation Age of Onset  . Hypertension Mother     History  Substance Use Topics  . Smoking status: Current Every Day Smoker -- 0.25 packs/day  . Smokeless tobacco: Never Used  . Alcohol Use: Yes    Allergies: No Known Allergies  Prescriptions prior to admission  Medication Sig Dispense Refill Last Dose  . naproxen sodium (ANAPROX) 220 MG tablet Take 220 mg by mouth once.   06/17/2014 at Unknown time  . acyclovir (ZOVIRAX) 400 MG tablet Take 1 tablet (400 mg total) by mouth 3 (three) times daily. (Patient taking differently: Take 400 mg by mouth 2 (two) times daily as needed (outbreaks). ) 21 tablet 0   . cephALEXin (KEFLEX) 500 MG capsule Take 1 capsule (500 mg total) by mouth 2 (two) times daily. (Patient not taking: Reported on 06/18/2014) 10 capsule 0   . doxycycline (VIBRA-TABS) 100 MG tablet Take 1 tablet (100 mg total) by mouth 2 (two) times daily. (Patient not taking: Reported on 02/21/2014) 28 tablet 0   . HYDROcodone-acetaminophen (NORCO/VICODIN) 5-325 MG per tablet Take 1 tablet by mouth every 6 (six) hours as needed for moderate pain or  severe pain. (Patient not taking: Reported on 06/18/2014) 6 tablet 0   . metroNIDAZOLE (FLAGYL) 500 MG tablet Take 1 tablet (500 mg total) by mouth 2 (two) times daily. (Patient not taking: Reported on 02/21/2014) 14 tablet 0     ROS Pertinent ROS in HPI  Physical Exam   Blood pressure 118/80, pulse 92, temperature 98 F (36.7 C), temperature source Oral, resp. rate 18, height 5\' 5"  (1.651 m), weight 113 lb 12.8 oz (51.619 kg), last menstrual period 06/06/2014, unknown if currently breastfeeding.  Physical Exam  MAU Course  Procedures  MDM Pt informed of Negative PRT.  She requests discharge.  She declines further eval.    Assessment and Plan  A:  1. Encounter for pregnancy test with result negative    P: Discharge to home Begin PNV as she is seeking conception Safe OTC med info provided F/U at HD Patient may return to MAU as needed or if her condition were to change or worsen   Bertram Denvereague Clark, Karen E 06/18/2014, 4:07 PM

## 2014-06-18 NOTE — MAU Note (Signed)
Pt presents complaining of constant headaches and nausea. Thinks she may be pregnant. Denies vaginal bleeding or discharge. Pt reports cramping on the right side.

## 2014-07-23 ENCOUNTER — Inpatient Hospital Stay (HOSPITAL_COMMUNITY)
Admission: AD | Admit: 2014-07-23 | Discharge: 2014-07-23 | Disposition: A | Discharge status: Home / Self Care | Payer: Self-pay | Source: Ambulatory Visit | Attending: Obstetrics & Gynecology | Admitting: Obstetrics & Gynecology

## 2014-07-23 ENCOUNTER — Encounter (HOSPITAL_COMMUNITY): Payer: Self-pay | Admitting: *Deleted

## 2014-07-23 DIAGNOSIS — R109 Unspecified abdominal pain: Secondary | ICD-10-CM | POA: Insufficient documentation

## 2014-07-23 DIAGNOSIS — A599 Trichomoniasis, unspecified: Secondary | ICD-10-CM

## 2014-07-23 DIAGNOSIS — A5402 Gonococcal vulvovaginitis, unspecified: Secondary | ICD-10-CM | POA: Insufficient documentation

## 2014-07-23 DIAGNOSIS — F17200 Nicotine dependence, unspecified, uncomplicated: Secondary | ICD-10-CM | POA: Insufficient documentation

## 2014-07-23 DIAGNOSIS — A549 Gonococcal infection, unspecified: Secondary | ICD-10-CM

## 2014-07-23 DIAGNOSIS — A5901 Trichomonal vulvovaginitis: Secondary | ICD-10-CM | POA: Insufficient documentation

## 2014-07-23 HISTORY — DX: Gonococcal infection, unspecified: A54.9

## 2014-07-23 HISTORY — DX: Chlamydial infection, unspecified: A74.9

## 2014-07-23 HISTORY — DX: Herpesviral infection of urogenital system, unspecified: A60.00

## 2014-07-23 LAB — URINALYSIS, ROUTINE W REFLEX MICROSCOPIC
Bilirubin Urine: NEGATIVE
GLUCOSE, UA: NEGATIVE mg/dL
KETONES UR: NEGATIVE mg/dL
Nitrite: NEGATIVE
Protein, ur: 30 mg/dL — AB
Specific Gravity, Urine: 1.025 (ref 1.005–1.030)
Urobilinogen, UA: 0.2 mg/dL (ref 0.0–1.0)
pH: 7.5 (ref 5.0–8.0)

## 2014-07-23 LAB — POCT PREGNANCY, URINE: PREG TEST UR: NEGATIVE

## 2014-07-23 LAB — WET PREP, GENITAL
Clue Cells Wet Prep HPF POC: NONE SEEN
Yeast Wet Prep HPF POC: NONE SEEN

## 2014-07-23 LAB — URINE MICROSCOPIC-ADD ON

## 2014-07-23 MED ORDER — CEFTRIAXONE SODIUM 250 MG IJ SOLR
250.0000 mg | Freq: Once | INTRAMUSCULAR | Status: AC
  Administered 2014-07-23: 250 mg via INTRAMUSCULAR
  Filled 2014-07-23: qty 250

## 2014-07-23 MED ORDER — METRONIDAZOLE 500 MG PO TABS
2000.0000 mg | ORAL_TABLET | Freq: Once | ORAL | Status: AC
  Administered 2014-07-23: 2000 mg via ORAL
  Filled 2014-07-23: qty 4

## 2014-07-23 MED ORDER — AZITHROMYCIN 250 MG PO TABS
1000.0000 mg | ORAL_TABLET | Freq: Once | ORAL | Status: AC
  Administered 2014-07-23: 1000 mg via ORAL
  Filled 2014-07-23: qty 4

## 2014-07-23 NOTE — Discharge Instructions (Signed)
Trichomoniasis Trichomoniasis is an infection caused by an organism called Trichomonas. The infection can affect both women and men. In women, the outer female genitalia and the vagina are affected. In men, the penis is mainly affected, but the prostate and other reproductive organs can also be involved. Trichomoniasis is a sexually transmitted infection (STI) and is most often passed to another person through sexual contact.  RISK FACTORS  Having unprotected sexual intercourse.  Having sexual intercourse with an infected partner. SIGNS AND SYMPTOMS  Symptoms of trichomoniasis in women include:  Abnormal gray-green frothy vaginal discharge.  Itching and irritation of the vagina.  Itching and irritation of the area outside the vagina. Symptoms of trichomoniasis in men include:   Penile discharge with or without pain.  Pain during urination. This results from inflammation of the urethra. DIAGNOSIS  Trichomoniasis may be found during a Pap test or physical exam. Your health care provider may use one of the following methods to help diagnose this infection:  Examining vaginal discharge under a microscope. For men, urethral discharge would be examined.  Testing the pH of the vagina with a test tape.  Using a vaginal swab test that checks for the Trichomonas organism. A test is available that provides results within a few minutes.  Doing a culture test for the organism. This is not usually needed. TREATMENT   You may be given medicine to fight the infection. Women should inform their health care provider if they could be or are pregnant. Some medicines used to treat the infection should not be taken during pregnancy.  Your health care provider may recommend over-the-counter medicines or creams to decrease itching or irritation.  Your sexual partner will need to be treated if infected. HOME CARE INSTRUCTIONS   Take medicines only as directed by your health care provider.  Take  over-the-counter medicine for itching or irritation as directed by your health care provider.  Do not have sexual intercourse while you have the infection.  Women should not douche or wear tampons while they have the infection.  Discuss your infection with your partner. Your partner may have gotten the infection from you, or you may have gotten it from your partner.  Have your sex partner get examined and treated if necessary.  Practice safe, informed, and protected sex.  See your health care provider for other STI testing. SEEK MEDICAL CARE IF:   You still have symptoms after you finish your medicine.  You develop abdominal pain.  You have pain when you urinate.  You have bleeding after sexual intercourse.  You develop a rash.  Your medicine makes you sick or makes you throw up (vomit). MAKE SURE YOU:  Understand these instructions.  Will watch your condition.  Will get help right away if you are not doing well or get worse. Document Released: 09/19/2000 Document Revised: 08/10/2013 Document Reviewed: 01/05/2013 [facility] Patient Information 2015 [place], [place]. This information is not intended to replace advice given to you by your health care provider. Make sure you discuss any questions you have with your health care provider. Gonorrhea Gonorrhea is an infection that can cause serious problems. If left untreated, the infection may:   Damage the female or female organs.   Cause women to be unable to have children (sterility).   Harm a fetus if the infected woman is pregnant.  It is important to get treatment for gonorrhea as soon as possible. It is also necessary that all your sexual partners be tested for the infection.  CAUSES  Gonorrhea is caused by bacteria called Neisseria gonorrhoeae. The infection is spread from person to person, usually by sexual contact (such as by anal, vaginal, or oral means). A newborn can contract the infection from his or her mother  during birth.  SYMPTOMS  Some people with gonorrhea do not have symptoms. Symptoms may be different in females and males.  Females The most common symptoms are:   Pain in the lower abdomen.   Fever with or without chills.  Other symptoms include:   Abnormal vaginal discharge.   Painful intercourse.   Burning or itching of the vagina or lips of the vagina.   Abnormal vaginal bleeding.   Pain when urinating.   Long-lasting (chronic) pain in the lower abdomen, especially during menstruation or intercourse.   Inability to become pregnant.   Going into premature labor.   Irritation, pain, bleeding, or discharge from the rectum. This may occur if the infection was spread by anal sex.   Sore throat or swollen lymph nodes in the neck. This may occur if the infection was spread by oral sex.  Males The most common symptoms are:   Discharge from the penis.   Pain or burning during urination.   Pain or swelling in the testicles. Other symptoms may include:   Irritation, pain, bleeding, or discharge from the rectum. This may occur if the infection was spread by anal sex.   Sore throat, fever, or swollen lymph nodes in the neck. This may occur if the infection was spread by oral sex.  DIAGNOSIS  A diagnosis is made after a physical exam is done and a sample of discharge is examined under a microscope for the presence of the bacteria. The discharge may be taken from the urethra, cervix, throat, or rectum.  TREATMENT  Gonorrhea is treated with antibiotic medicines. It is important for treatment to begin as soon as possible. Early treatment may prevent some problems from developing.  HOME CARE INSTRUCTIONS   Take medicines only as directed by your health care provider.   Take your antibiotic medicine as directed by your health care provider. Finish the antibiotic even if you start to feel better. Incomplete treatment will put you at risk for continued infection.    Do not have sex until treatment is complete or as directed by your health care provider.   Keep all follow-up visits as directed by your health care provider.   Not all test results are available during your visit. If your test results are not back during the visit, make an appointment with your health care provider to find out the results. Do not assume everything is normal if you have not heard from your health care provider or the medical facility. It is your responsibility to get your test results.  If you test positive for gonorrhea, inform your recent sexual partners. They need to be checked for gonorrhea even if they do not have symptoms. They may need treatment, even if they test negative for gonorrhea.  SEEK MEDICAL CARE IF:   You develop any bad reaction to the medicine you were prescribed. This may include:   A rash.   Nausea.   Vomiting.   Diarrhea.   Your symptoms do not improve after a few days of taking antibiotics.   Your symptoms get worse.   You develop increased pain, such as in the testicles (for males) or in the abdomen (for females).  You have a fever. MAKE SURE YOU:   Understand these  instructions.  Will watch your condition.  Will get help right away if you are not doing well or get worse. Document Released: 03/23/2000 Document Revised: 08/10/2013 Document Reviewed: 10/01/2012 [facility] Patient Information 2015 [place], [place]. This information is not intended to replace advice given to you by your health care provider. Make sure you discuss any questions you have with your health care provider.

## 2014-07-23 NOTE — MAU Note (Signed)
Past 2 months, has been having cramping off and on, like cycle is going to start.  Cycle has been irregular, usually lasts 5 days, only been 2 or 3 and lighter. Has been having headaches

## 2014-07-23 NOTE — MAU Provider Note (Signed)
History     CSN: [id number]  Arrival date and time: 07/23/14 1652   First Provider Initiated Contact with Patient 07/23/14 1748      Chief Complaint  Patient presents with  . Abdominal Cramping  . Possible Pregnancy   HPI Beth Vargas 24 y.o. G1P1001 nonpregnant female presents for pregnancy test and vaginal discharge.  She has noticed greenish discharge over the last 1-2 weeks.  She has urinary frequency, dysuria and pelvic cramps as well. She denies itching, abdominal pain, nausea, vomiting, diarrhea, constipation, weakness, fever.   She declines STD testing as she had that done a few months ago but feels she may have UTI and wants to be checked for that.  She does want a pelvic exam to look for yeast infection.   OB History    Gravida Para Term Preterm AB TAB SAB Ectopic Multiple Living   0 0 0 0 0 0 1      Past Medical History  Diagnosis Date  . No pertinent past medical history   . Medical history non-contributory   . Herpes genitalia   . Gonorrhea   . Chlamydia     Past Surgical History  Procedure Laterality Date  . No past surgeries      Family History  Problem Relation Age of Onset  . Hypertension Mother     History  Substance Use Topics  . Smoking status: Current Every Day Smoker -- 0.25 packs/day  . Smokeless tobacco: Never Used  . Alcohol Use: No    Allergies: No Known Allergies  Prescriptions prior to admission  Medication Sig Dispense Refill Last Dose  . acyclovir (ZOVIRAX) 400 MG tablet Take 1 tablet (400 mg total) by mouth 3 (three) times daily. (Patient taking differently: Take 400 mg by mouth 2 (two) times daily as needed (outbreaks). ) 21 tablet 0 prn    ROS Pertinent ROS in HPI  Physical Exam   Blood pressure 119/68, pulse 102, temperature 98.4 F (36.9 C), temperature source Oral, resp. rate 16, height  (1.6 m), weight 116 lb (52.617 kg), last menstrual period 07/06/2014, unknown if currently breastfeeding.  Physical  Exam  Constitutional: She is oriented to person, place, and time. She appears well-developed and well-nourished. No distress.  HENT:  Head: Normocephalic and atraumatic.  Eyes: EOM are normal.  Neck: Normal range of motion.  Cardiovascular: Normal rate and regular rhythm.   Respiratory: Effort normal and breath sounds normal. No respiratory distress.  GI: Soft. Bowel sounds are normal. She exhibits no distension.  Mildly ttp in LLQ  Genitourinary:  Vaginal with thin white discharge with noted froth.   Cervix is erythematous surrounding os with large amt of yellow mucus coming from os.   No CMT.  NO adnexal mass or tenderness appreciated  Musculoskeletal: Normal range of motion.  Neurological: She is alert and oriented to person, place, and time.  Skin: Skin is warm.  Psychiatric: She has a normal mood and affect.   Results for orders placed or performed during the hospital encounter of 07/23/14 (from the past 24 hour(s))  Urinalysis, Routine w reflex microscopic     Status: Abnormal   Collection Time: 07/23/14  5:15 PM  Result Value Ref Range   Color, Urine YELLOW YELLOW   APPearance CLOUDY (A) CLEAR   Specific Gravity, Urine 1.025 1.005 - 1.030   pH 7.5 5.0 - 8.0   Glucose, UA NEGATIVE NEGATIVE mg/dL   Hgb urine dipstick TRACE (A) NEGATIVE  Bilirubin Urine NEGATIVE NEGATIVE   Ketones, ur NEGATIVE NEGATIVE mg/dL   Protein, ur 30 (A) NEGATIVE mg/dL   Urobilinogen, UA 0.2 0.0 - 1.0 mg/dL   Nitrite NEGATIVE NEGATIVE   Leukocytes, UA LARGE (A) NEGATIVE  Urine microscopic-add on     Status: Abnormal   Collection Time: 07/23/14  5:15 PM  Result Value Ref Range   Squamous Epithelial / LPF MANY (A) RARE   WBC, UA 21-50 <3 WBC/hpf   RBC / HPF 0-2 <3 RBC/hpf   Bacteria, UA FEW (A) RARE  Pregnancy, urine POC     Status: None   Collection Time: 07/23/14  5:37 PM  Result Value Ref Range   Preg Test, Ur NEGATIVE NEGATIVE  Wet prep, genital     Status: Abnormal   Collection Time:  07/23/14  6:02 PM  Result Value Ref Range   Yeast Wet Prep HPF POC NONE SEEN NONE SEEN   Trich, Wet Prep FEW (A) NONE SEEN   Clue Cells Wet Prep HPF POC NONE SEEN NONE SEEN   WBC, Wet Prep HPF POC FEW (A) NONE SEEN    MAU Course  Procedures  MDM Clinical diagnosis of gonorrhea Trich noted on wet prep 3 abx required for these treatments.  Will hold on additional abx assuming u/a results are due to above dx.  Flagyl x 2 grams + azith x 1 gram + rocephin 250mg  IM in MAU.     Assessment and Plan  A:  1. Gonorrhea   2. Trichomonas vaginalis infection    P: Discharge to home Notify partners. No IC until 10 days after all parties are treated No etoh x 1 week Go to HD for partner treatment/further treatments or testing Condoms for all IC Patient may return to MAU as needed or if her condition were to change or worsen    Bertram Denvereague Clark, Karen E 07/23/2014, 5:49 PM

## 2014-07-26 LAB — GC/CHLAMYDIA PROBE AMP (~~LOC~~) NOT AT ARMC
Chlamydia: POSITIVE — AB
Neisseria Gonorrhea: POSITIVE — AB

## 2014-08-07 ENCOUNTER — Emergency Department (HOSPITAL_COMMUNITY)
Admission: EM | Admit: 2014-08-07 | Discharge: 2014-08-08 | Disposition: A | Discharge status: Home / Self Care | Payer: Self-pay | Attending: Emergency Medicine | Admitting: Emergency Medicine

## 2014-08-07 ENCOUNTER — Encounter (HOSPITAL_COMMUNITY): Payer: Self-pay | Admitting: Emergency Medicine

## 2014-08-07 DIAGNOSIS — K0381 Cracked tooth: Secondary | ICD-10-CM | POA: Insufficient documentation

## 2014-08-07 DIAGNOSIS — K002 Abnormalities of size and form of teeth: Secondary | ICD-10-CM | POA: Insufficient documentation

## 2014-08-07 DIAGNOSIS — Z720 Tobacco use: Secondary | ICD-10-CM | POA: Insufficient documentation

## 2014-08-07 DIAGNOSIS — R110 Nausea: Secondary | ICD-10-CM | POA: Insufficient documentation

## 2014-08-07 DIAGNOSIS — Z8619 Personal history of other infectious and parasitic diseases: Secondary | ICD-10-CM | POA: Insufficient documentation

## 2014-08-07 DIAGNOSIS — Z79899 Other long term (current) drug therapy: Secondary | ICD-10-CM | POA: Insufficient documentation

## 2014-08-07 DIAGNOSIS — K029 Dental caries, unspecified: Secondary | ICD-10-CM | POA: Insufficient documentation

## 2014-08-07 MED ORDER — PENICILLIN V POTASSIUM 500 MG PO TABS: 500.0000 mg | ORAL_TABLET | Freq: Four times a day (QID) | ORAL | Status: AC

## 2014-08-07 MED ORDER — NAPROXEN 500 MG PO TABS: 500.0000 mg | ORAL_TABLET | Freq: Two times a day (BID) | ORAL | Status: DC

## 2014-08-07 MED ORDER — TRAMADOL HCL 50 MG PO TABS: 50.0000 mg | ORAL_TABLET | Freq: Four times a day (QID) | ORAL | Status: DC | PRN

## 2014-08-07 NOTE — Discharge Instructions (Signed)
Dental Fracture You have a dental fracture or injury. This can mean the tooth is loose, has a chip in the enamel or is broken. If just the outer enamel is chipped, there is a good chance the tooth will not become infected. The only treatment needed may be to smooth off a rough edge. Fractures into the deeper layers (dentin and pulp) cause greater pain and are more likely to become infected. These require you to see a dentist as soon as possible to save the tooth. Loose teeth may need to be wired or bonded with a plastic splint to hold them in place. A paste may be painted on the open area of the broken tooth to reduce the pain. Antibiotics and pain medicine may be prescribed. Choosing a soft or liquid diet and rinsing the mouth out with warm water after meals may be helpful. See your dentist as recommended. Failure to seek care or follow up with a dentist or other specialist as recommended could result in the loss of your tooth, infection, or permanent dental problems. SEEK MEDICAL CARE IF:   You have increased pain not controlled with medicines.  You have swelling around the tooth, in the face or neck.  You have bleeding which starts, continues, or gets worse.  You have a fever. Document Released: 05/03/2004 Document Revised: 06/18/2011 Document Reviewed: 02/15/2009 [facility] Patient Information 2015 [place]. This information is not intended to replace advice given to you by your health care provider. Make sure you discuss any questions you have with your health care provider.  Dental Caries Dental caries is tooth decay. This decay can cause a hole in teeth (cavity) that can get bigger and deeper over time. HOME CARE  Brush and floss your teeth. Do this at least two times a day.  Use a fluoride toothpaste.  Use a mouth rinse if told by your dentist or doctor.  Eat less sugary and starchy foods. Drink less sugary drinks.  Avoid snacking often on sugary and starchy foods. Avoid  sipping often on sugary drinks.  Keep regular checkups and cleanings with your dentist.  Use fluoride supplements if told by your dentist or doctor.  Allow fluoride to be applied to teeth if told by your dentist or doctor. Document Released: 01/03/2008 Document Revised: 08/10/2013 Document Reviewed: 03/28/2012 [facility] Patient Information 2015 [place]. This information is not intended to replace advice given to you by your health care provider. Make sure you discuss any questions you have with your health care provider.  Dental Care and Dentist Visits Dental care supports good overall health. Regular dental visits can also help you avoid dental pain, bleeding, infection, and other more serious health problems in the future. It is important to keep the mouth healthy because diseases in the teeth, gums, and other oral tissues can spread to other areas of the body. Some problems, such as diabetes, heart disease, and pre-term labor have been associated with poor oral health.  See your dentist every 6 months. If you experience emergency problems such as a toothache or broken tooth, go to the dentist right away. If you see your dentist regularly, you may catch problems early. It is easier to be treated for problems in the early stages.  WHAT TO EXPECT AT A DENTIST VISIT  Your dentist will look for many common oral health problems and recommend proper treatment. At your regular dental visit, you can expect:  Gentle cleaning of the teeth and gums. This includes scraping and polishing. This  helps to remove the sticky substance around the teeth and gums (plaque). Plaque forms in the mouth shortly after eating. Over time, plaque hardens on the teeth as tartar. If tartar is not removed regularly, it can cause problems. Cleaning also helps remove stains.  Periodic X-rays. These pictures of the teeth and supporting bone will help your dentist assess the health of your teeth.  Periodic fluoride  treatments. Fluoride is a natural mineral shown to help strengthen teeth. Fluoride treatmentinvolves applying a fluoride gel or varnish to the teeth. It is most commonly done in children.  Examination of the mouth, tongue, jaws, teeth, and gums to look for any oral health problems, such as:  Cavities (dental caries). This is decay on the tooth caused by plaque, sugar, and acid in the mouth. It is best to catch a cavity when it is small.  Inflammation of the gums caused by plaque buildup (gingivitis).  Problems with the mouth or malformed or misaligned teeth.  Oral cancer or other diseases of the soft tissues or jaws. KEEP YOUR TEETH AND GUMS HEALTHY For healthy teeth and gums, follow these general guidelines as well as your dentist's specific advice:  Have your teeth professionally cleaned at the dentist every 6 months.  Brush twice daily with a fluoride toothpaste.  Floss your teeth daily.  Ask your dentist if you need fluoride supplements, treatments, or fluoride toothpaste.  Eat a healthy diet. Reduce foods and drinks with added sugar.  Avoid smoking. TREATMENT FOR ORAL HEALTH PROBLEMS If you have oral health problems, treatment varies depending on the conditions present in your teeth and gums.  Your caregiver will most likely recommend good oral hygiene at each visit.  For cavities, gingivitis, or other oral health disease, your caregiver will perform a procedure to treat the problem. This is typically done at a separate appointment. Sometimes your caregiver will refer you to another dental specialist for specific tooth problems or for surgery. SEEK IMMEDIATE DENTAL CARE IF:  You have pain, bleeding, or soreness in the gum, tooth, jaw, or mouth area.  A permanent tooth becomes loose or separated from the gum socket.  You experience a blow or injury to the mouth or jaw area. Document Released: 12/06/2010 Document Revised: 06/18/2011 Document Reviewed: 12/06/2010 [facility]  Patient Information 2015 [place], [place]. This information is not intended to replace advice given to you by your health care provider. Make sure you discuss any questions you have with your health care provider.                                                                     Dental Resources  Patients with Medicaid: [facility] [phone] [address], [phone] [address] [phone]  If unable to pay, or uninsured, contact [facility] [phone]) or [facility] Department [phone] in [place], [phone] in [facility]) to become qualified for the adult dental clinic  Other Low-Cost Community Dental Services: [facility]- [address] Natasha Bence [place], [place], [address]    [phone], Ext. [phone]    2nd and 4th Thursday of the month at 6:30am    10 clients each day by appointment, can sometimes see walk-in patients if someone does not show for an appointment [facility]- [address] Ether Griffins [place], [place], [address]    [phone] [facility] [address], [place], [place], [address]    [phone]  [facility]- [phone] [facility]- [phone] [phone]

## 2014-08-07 NOTE — ED Provider Notes (Signed)
CSN: [id number]     Arrival date & time 08/07/14  2242 History   First MD Initiated Contact with Patient 08/07/14 2307     Chief Complaint  Patient presents with  . Dental Pain     (Consider location/radiation/quality/duration/timing/severity/associated sxs/prior Treatment) HPI Pt is a 23yo female presenting to ED with c/o gradually worsening Right upper tooth pain that started about 1 month ago, but worsened in last few days.  Pt described pain as aching and throbbing, 9/10, worse with cold air, eating and drinking. Reports taking Advil, using Orajel, mouthwash and brushing w/o relief.  Pt does not have a dentist. Denies trauma to tooth. States over the last month pieces of her tooth have slowly fallen off. No fever or chills. Denies difficulty breathing or swallowing. She has had mild nausea. No allergies to medications.    Past Medical History  Diagnosis Date  . No pertinent past medical history   . Medical history non-contributory   . Herpes genitalia   . Gonorrhea   . Chlamydia    Past Surgical History  Procedure Laterality Date  . No past surgeries     Family History  Problem Relation Age of Onset  . Hypertension Mother    History  Substance Use Topics  . Smoking status: Current Every Day Smoker -- 0.25 packs/day  . Smokeless tobacco: Never Used  . Alcohol Use: No   OB History    Gravida Para Term Preterm AB TAB SAB Ectopic Multiple Living   1 1 1  0 0 0 0 0 0 1     Review of Systems  Constitutional: Negative for fever and chills.  HENT: Positive for dental problem. Negative for drooling, sore throat, trouble swallowing and voice change.   Gastrointestinal: Positive for nausea. Negative for vomiting, abdominal pain and diarrhea.  All other systems reviewed and are negative.     Allergies  Review of patient's allergies indicates no known allergies.  Home Medications   Prior to Admission medications   Medication Sig Start Date End Date Taking? Authorizing  Provider  acyclovir (ZOVIRAX) 400 MG tablet Take 1 tablet (400 mg total) by mouth 3 (three) times daily. Patient taking differently: Take 400 mg by mouth 2 (two) times daily as needed (outbreaks).  02/21/14   Tilden FossaElizabeth Rees, MD  naproxen (NAPROSYN) 500 MG tablet Take 1 tablet (500 mg total) by mouth 2 (two) times daily. 08/07/14   Junius FinnerErin O'Malley, PA-C  penicillin v potassium (VEETID) 500 MG tablet Take 1 tablet (500 mg total) by mouth 4 (four) times daily. 08/07/14 08/14/14  Junius FinnerErin O'Malley, PA-C  traMADol (ULTRAM) 50 MG tablet Take 1 tablet (50 mg total) by mouth every 6 (six) hours as needed. 08/07/14   Junius FinnerErin O'Malley, PA-C   BP 126/71 mmHg  Pulse 86  Temp(Src) 98.3 F (36.8 C) (Oral)  Resp 14  SpO2 98%  LMP 07/04/2014 (Exact Date) Physical Exam  Constitutional: She is oriented to person, place, and time. She appears well-developed and well-nourished.  HENT:  Head: Normocephalic and atraumatic.  Nose: Nose normal.  Mouth/Throat: Uvula is midline, oropharynx is clear and moist and mucous membranes are normal. No trismus in the jaw. Abnormal dentition. Dental caries present. No dental abscesses.    Right upper tooth: chipped, dentin exposed. No gingival edema or erythema. No bleeding or discharge.  Oropharynx clear.  Eyes: EOM are normal.  Neck: Normal range of motion. Neck supple.  Cardiovascular: Normal rate.   Pulmonary/Chest: Effort normal.  Musculoskeletal: Normal range of  motion.  Neurological: She is alert and oriented to person, place, and time.  Skin: Skin is warm and dry.  Psychiatric: She has a normal mood and affect. Her behavior is normal.  Nursing note and vitals reviewed.   ED Course  Procedures (including critical care time) Labs Review Labs Reviewed - No data to display  Imaging Review No results found.   EKG Interpretation None      MDM   Final diagnoses:  Pain due to dental caries  Cracked tooth    Pt is a 23yo female presenting to ED with c/o Right  upper tooth pain. Tooth cracked, dentin exposed. No gingival abscess on exam. Will place pt on PCN and prescribe tramadol and naproxen for pain.  Dental resources provided as well as resources for Dr. Russella Dar, DDS. Advised to call on Monday to schedule f/u appointment for dental chipped tooth and dental pain. Home care instructions provided. Pt verbalized understanding and agreement with tx plan.      Junius Finner, PA-C 08/07/14 1610  Tomasita Crumble, MD 08/08/14 [phone]

## 2014-08-07 NOTE — ED Notes (Signed)
Patient here with right upper dental pain. States that she began to have pain there about 1 month ago. Decayed tooth present in area. Patient states very sensitive to cold. Wearing mask in triage because she states cool air aggravates pain.

## 2014-08-08 NOTE — ED Notes (Signed)
Pt. Left with all belongings and refused wheelchair 

## 2014-09-04 ENCOUNTER — Inpatient Hospital Stay (HOSPITAL_COMMUNITY)
Admission: AD | Admit: 2014-09-04 | Discharge: 2014-09-04 | Discharge status: Against Medical Advice | Payer: Self-pay | Source: Ambulatory Visit | Attending: Obstetrics & Gynecology | Admitting: Obstetrics & Gynecology

## 2014-09-04 ENCOUNTER — Encounter (HOSPITAL_COMMUNITY): Payer: Self-pay | Admitting: *Deleted

## 2014-09-04 DIAGNOSIS — Z8619 Personal history of other infectious and parasitic diseases: Secondary | ICD-10-CM | POA: Insufficient documentation

## 2014-09-04 DIAGNOSIS — R110 Nausea: Secondary | ICD-10-CM | POA: Insufficient documentation

## 2014-09-04 DIAGNOSIS — F17210 Nicotine dependence, cigarettes, uncomplicated: Secondary | ICD-10-CM | POA: Insufficient documentation

## 2014-09-04 DIAGNOSIS — Z8249 Family history of ischemic heart disease and other diseases of the circulatory system: Secondary | ICD-10-CM | POA: Insufficient documentation

## 2014-09-04 DIAGNOSIS — R1030 Lower abdominal pain, unspecified: Secondary | ICD-10-CM | POA: Insufficient documentation

## 2014-09-04 LAB — URINE MICROSCOPIC-ADD ON

## 2014-09-04 LAB — URINALYSIS, ROUTINE W REFLEX MICROSCOPIC
Bilirubin Urine: NEGATIVE
Glucose, UA: NEGATIVE mg/dL
HGB URINE DIPSTICK: NEGATIVE
Ketones, ur: 15 mg/dL — AB
Leukocytes, UA: NEGATIVE
NITRITE: NEGATIVE
PH: 6 (ref 5.0–8.0)
PROTEIN: 100 mg/dL — AB
Urobilinogen, UA: 1 mg/dL (ref 0.0–1.0)

## 2014-09-04 LAB — POCT PREGNANCY, URINE: PREG TEST UR: NEGATIVE

## 2014-09-04 NOTE — MAU Provider Note (Signed)
History     CSN: [id number]  Arrival date and time: 09/04/14 1191   First Provider Initiated Contact with Patient 09/04/14 [phone]      Chief Complaint  Patient presents with  . Abdominal Pain  . Nausea   HPI  [name] is a 24 y.o. G1P1001 who presents to MAU today with complaint of lower abdominal cramping off and on x 1 month. The patient also states associated nausea today. She denies vomiting, diarrhea, constipation, fever, UTI symptoms, vaginal bleeding or discharge. She is sexually active and dose not use protection. She states that she is attempting to conceive. She states pain is 7/10 at the worst, but denies pain now. She has not taken anything for pain. LMP 08/04/14. Patient has not taken HPT.   OB History    Gravida Para Term Preterm AB TAB SAB Ectopic Multiple Living   0 0 0 0 0 0 1      Past Medical History  Diagnosis Date  . No pertinent past medical history   . Medical history non-contributory   . Herpes genitalia   . Gonorrhea   . Chlamydia     Past Surgical History  Procedure Laterality Date  . No past surgeries      Family History  Problem Relation Age of Onset  . Hypertension Mother     History  Substance Use Topics  . Smoking status: Current Every Day Smoker -- 0.25 packs/day  . Smokeless tobacco: Never Used  . Alcohol Use: No    Allergies: No Known Allergies  Prescriptions prior to admission  Medication Sig Dispense Refill Last Dose  . acyclovir (ZOVIRAX) 400 MG tablet Take 1 tablet (400 mg total) by mouth 3 (three) times daily. (Patient taking differently: Take 400 mg by mouth 2 (two) times daily as needed (outbreaks). ) 21 tablet 0 prn  . naproxen (NAPROSYN) 500 MG tablet Take 1 tablet (500 mg total) by mouth 2 (two) times daily. 30 tablet 0   . traMADol (ULTRAM) 50 MG tablet Take 1 tablet (50 mg total) by mouth every 6 (six) hours as needed. 15 tablet 0     Review of Systems  Constitutional: Negative for fever and  malaise/fatigue.  Gastrointestinal: Positive for nausea and abdominal pain. Negative for vomiting, diarrhea and constipation.  Genitourinary: Negative for dysuria, urgency and frequency.       Neg - vaginal bleeding, discharge   Physical Exam   Blood pressure 125/75, pulse 85, temperature 98.2 F (36.8 C), resp. rate 18, height  (1.6 m), weight 111 lb (50.349 kg), last menstrual period 08/04/2014, unknown if currently breastfeeding.  Physical Exam  Nursing note and vitals reviewed. Constitutional: She is oriented to person, place, and time. She appears well-developed and well-nourished. No distress.  HENT:  Head: Normocephalic and atraumatic.  Cardiovascular: Normal rate, regular rhythm and normal heart sounds.   Respiratory: Effort normal and breath sounds normal. No respiratory distress.  GI: Soft. Bowel sounds are normal. She exhibits no distension and no mass. There is tenderness (mild tenderness to palpation of the LLQ). There is no rebound and no guarding.  Neurological: She is alert and oriented to person, place, and time.  Skin: Skin is warm and dry. No erythema.  Psychiatric: She has a normal mood and affect.   Results for orders placed or performed during the hospital encounter of 09/04/14 (from the past 24 hour(s))  Pregnancy, urine POC     Status: None   Collection  Time: 09/04/14  9:23 AM  Result Value Ref Range   Preg Test, Ur NEGATIVE NEGATIVE    MAU Course  Procedures None  MDM UPT - negative UA, wet prep, GC/Chlamydia, RPR, HIV and CBC today Prior to pelvic exam and blood draw patient states that she has to leave for an emergency. Advised she would be leaving AMA since work-up for abdominal pain was not completed.  Patient signed AMA forms and left in stable condition.   Marny LowensteinJulie N Bradlee Bridgers, PA-C  09/04/2014 9:37 AM  Assessment and Plan

## 2014-09-04 NOTE — MAU Note (Signed)
Pt presents to MAU with complaints of lower abdominal cramping for a month and her menstrual cycle is 4 days late

## 2014-09-21 ENCOUNTER — Encounter (HOSPITAL_COMMUNITY): Payer: Self-pay | Admitting: Emergency Medicine

## 2014-09-21 ENCOUNTER — Emergency Department (HOSPITAL_COMMUNITY)
Admission: EM | Admit: 2014-09-21 | Discharge: 2014-09-21 | Discharge status: Against Medical Advice | Payer: Self-pay | Attending: Emergency Medicine | Admitting: Emergency Medicine

## 2014-09-21 DIAGNOSIS — Z720 Tobacco use: Secondary | ICD-10-CM | POA: Insufficient documentation

## 2014-09-21 DIAGNOSIS — R109 Unspecified abdominal pain: Secondary | ICD-10-CM | POA: Insufficient documentation

## 2014-09-21 DIAGNOSIS — R51 Headache: Secondary | ICD-10-CM | POA: Insufficient documentation

## 2014-09-21 LAB — URINALYSIS, ROUTINE W REFLEX MICROSCOPIC
Bilirubin Urine: NEGATIVE
GLUCOSE, UA: NEGATIVE mg/dL
Ketones, ur: NEGATIVE mg/dL
Nitrite: POSITIVE — AB
Protein, ur: 100 mg/dL — AB
Specific Gravity, Urine: 1.013 (ref 1.005–1.030)
UROBILINOGEN UA: 0.2 mg/dL (ref 0.0–1.0)
pH: 7.5 (ref 5.0–8.0)

## 2014-09-21 LAB — URINE MICROSCOPIC-ADD ON

## 2014-09-21 NOTE — ED Notes (Signed)
No answer x2 

## 2014-09-21 NOTE — ED Notes (Signed)
No answer x3

## 2014-09-21 NOTE — ED Notes (Signed)
Pt. Stated, I've been having some abdominal pain on the right and my head hurts some.  I've also had some pressure down there (vaginal area)

## 2014-09-21 NOTE — ED Notes (Signed)
No answer x1

## 2014-09-22 ENCOUNTER — Telehealth (HOSPITAL_COMMUNITY): Payer: Self-pay

## 2014-09-28 ENCOUNTER — Encounter (HOSPITAL_COMMUNITY): Payer: Self-pay | Admitting: Nurse Practitioner

## 2014-09-28 ENCOUNTER — Emergency Department (HOSPITAL_COMMUNITY)
Admission: EM | Admit: 2014-09-28 | Discharge: 2014-09-28 | Discharge status: Against Medical Advice | Payer: Self-pay | Attending: Emergency Medicine | Admitting: Emergency Medicine

## 2014-09-28 DIAGNOSIS — R102 Pelvic and perineal pain: Secondary | ICD-10-CM | POA: Insufficient documentation

## 2014-09-28 DIAGNOSIS — Z720 Tobacco use: Secondary | ICD-10-CM | POA: Insufficient documentation

## 2014-09-28 DIAGNOSIS — G43909 Migraine, unspecified, not intractable, without status migrainosus: Secondary | ICD-10-CM | POA: Insufficient documentation

## 2014-09-28 DIAGNOSIS — R6883 Chills (without fever): Secondary | ICD-10-CM | POA: Insufficient documentation

## 2014-09-28 DIAGNOSIS — M549 Dorsalgia, unspecified: Secondary | ICD-10-CM | POA: Insufficient documentation

## 2014-09-28 DIAGNOSIS — K5900 Constipation, unspecified: Secondary | ICD-10-CM | POA: Insufficient documentation

## 2014-09-28 DIAGNOSIS — Z5321 Procedure and treatment not carried out due to patient leaving prior to being seen by health care provider: Secondary | ICD-10-CM

## 2014-09-28 DIAGNOSIS — R109 Unspecified abdominal pain: Secondary | ICD-10-CM | POA: Insufficient documentation

## 2014-09-28 HISTORY — DX: Migraine, unspecified, not intractable, without status migrainosus: G43.909

## 2014-09-28 NOTE — ED Notes (Signed)
She c/o migraine for past 2 weeks, she  Has a history of migraines and has tried aleve with no relief. this week she began to have R side and back pain, chills, pelvic cramps, and she noticed her BP was high when she checked it at home. Denies n/v/urinary changes/vaginal discharge or bleeding. Reports constipation x3 days.

## 2014-09-28 NOTE — ED Notes (Signed)
No answer x1

## 2014-09-28 NOTE — ED Provider Notes (Signed)
1:23 PM Left after triage. Was not seen by a doctor.   Clinical Impression 1. Patient left without being seen      Beth Sheffield, MD 09/28/14 1323

## 2014-09-30 ENCOUNTER — Emergency Department (HOSPITAL_COMMUNITY)
Admission: EM | Admit: 2014-09-30 | Discharge: 2014-09-30 | Disposition: A | Discharge status: Home / Self Care | Payer: Self-pay | Attending: Emergency Medicine | Admitting: Emergency Medicine

## 2014-09-30 ENCOUNTER — Encounter (HOSPITAL_COMMUNITY): Payer: Self-pay | Admitting: *Deleted

## 2014-09-30 DIAGNOSIS — Z720 Tobacco use: Secondary | ICD-10-CM | POA: Insufficient documentation

## 2014-09-30 DIAGNOSIS — Z8679 Personal history of other diseases of the circulatory system: Secondary | ICD-10-CM | POA: Insufficient documentation

## 2014-09-30 DIAGNOSIS — N12 Tubulo-interstitial nephritis, not specified as acute or chronic: Secondary | ICD-10-CM | POA: Insufficient documentation

## 2014-09-30 DIAGNOSIS — Z8619 Personal history of other infectious and parasitic diseases: Secondary | ICD-10-CM | POA: Insufficient documentation

## 2014-09-30 DIAGNOSIS — Z3202 Encounter for pregnancy test, result negative: Secondary | ICD-10-CM | POA: Insufficient documentation

## 2014-09-30 LAB — CBC WITH DIFFERENTIAL/PLATELET
BASOS PCT: 0 % (ref 0–1)
Basophils Absolute: 0 10*3/uL (ref 0.0–0.1)
EOS ABS: 0 10*3/uL (ref 0.0–0.7)
Eosinophils Relative: 0 % (ref 0–5)
HCT: 38.7 % (ref 36.0–46.0)
Hemoglobin: 13.7 g/dL (ref 12.0–15.0)
LYMPHS ABS: 2.3 10*3/uL (ref 0.7–4.0)
Lymphocytes Relative: 22 % (ref 12–46)
MCH: 30 pg (ref 26.0–34.0)
MCHC: 35.4 g/dL (ref 30.0–36.0)
MCV: 84.7 fL (ref 78.0–100.0)
Monocytes Absolute: 1.1 10*3/uL — ABNORMAL HIGH (ref 0.1–1.0)
Monocytes Relative: 10 % (ref 3–12)
NEUTROS ABS: 7.1 10*3/uL (ref 1.7–7.7)
NEUTROS PCT: 68 % (ref 43–77)
PLATELETS: 298 10*3/uL (ref 150–400)
RBC: 4.57 MIL/uL (ref 3.87–5.11)
RDW: 13 % (ref 11.5–15.5)
WBC: 10.5 10*3/uL (ref 4.0–10.5)

## 2014-09-30 LAB — URINALYSIS, ROUTINE W REFLEX MICROSCOPIC
Bilirubin Urine: NEGATIVE
GLUCOSE, UA: NEGATIVE mg/dL
KETONES UR: 15 mg/dL — AB
Nitrite: POSITIVE — AB
PROTEIN: 30 mg/dL — AB
Specific Gravity, Urine: 1.017 (ref 1.005–1.030)
UROBILINOGEN UA: 1 mg/dL (ref 0.0–1.0)
pH: 6 (ref 5.0–8.0)

## 2014-09-30 LAB — URINE MICROSCOPIC-ADD ON

## 2014-09-30 LAB — COMPREHENSIVE METABOLIC PANEL
ALT: 7 U/L — AB (ref 14–54)
AST: 13 U/L — ABNORMAL LOW (ref 15–41)
Albumin: 3.3 g/dL — ABNORMAL LOW (ref 3.5–5.0)
Alkaline Phosphatase: 58 U/L (ref 38–126)
Anion gap: 11 (ref 5–15)
BILIRUBIN TOTAL: 0.3 mg/dL (ref 0.3–1.2)
BUN: <5 mg/dL — ABNORMAL LOW (ref 6–20)
CALCIUM: 8.7 mg/dL — AB (ref 8.9–10.3)
CHLORIDE: 103 mmol/L (ref 101–111)
CO2: 23 mmol/L (ref 22–32)
Creatinine, Ser: 0.77 mg/dL (ref 0.44–1.00)
GLUCOSE: 99 mg/dL (ref 65–99)
Potassium: 3.2 mmol/L — ABNORMAL LOW (ref 3.5–5.1)
Sodium: 137 mmol/L (ref 135–145)
Total Protein: 7.4 g/dL (ref 6.5–8.1)

## 2014-09-30 LAB — PREGNANCY, URINE: Preg Test, Ur: NEGATIVE

## 2014-09-30 LAB — LIPASE, BLOOD: LIPASE: 16 U/L — AB (ref 22–51)

## 2014-09-30 MED ORDER — SODIUM CHLORIDE 0.9 % IV BOLUS (SEPSIS)
1000.0000 mL | Freq: Once | INTRAVENOUS | Status: AC
  Administered 2014-09-30: 1000 mL via INTRAVENOUS

## 2014-09-30 MED ORDER — CEFTRIAXONE SODIUM 1 G IJ SOLR
1.0000 g | Freq: Once | INTRAMUSCULAR | Status: AC
  Administered 2014-09-30: 1 g via INTRAVENOUS
  Filled 2014-09-30: qty 10

## 2014-09-30 MED ORDER — KETOROLAC TROMETHAMINE 30 MG/ML IJ SOLN
30.0000 mg | Freq: Once | INTRAMUSCULAR | Status: AC
  Administered 2014-09-30: 30 mg via INTRAVENOUS
  Filled 2014-09-30: qty 1

## 2014-09-30 MED ORDER — IBUPROFEN 600 MG PO TABS: 600.0000 mg | ORAL_TABLET | Freq: Four times a day (QID) | ORAL | Status: DC | PRN

## 2014-09-30 MED ORDER — HYDROCODONE-ACETAMINOPHEN 5-325 MG PO TABS: 1.0000 | ORAL_TABLET | ORAL | Status: DC | PRN

## 2014-09-30 MED ORDER — CEPHALEXIN 500 MG PO CAPS: 500.0000 mg | ORAL_CAPSULE | Freq: Four times a day (QID) | ORAL | Status: DC

## 2014-09-30 NOTE — Discharge Instructions (Signed)
Pyelonephritis, Adult °Pyelonephritis is a kidney infection. In general, there are 2 main types of pyelonephritis: °· Infections that come on quickly without any warning (acute pyelonephritis). °· Infections that persist for a long period of time (chronic pyelonephritis). °CAUSES  °Two main causes of pyelonephritis are: °· Bacteria traveling from the bladder to the kidney. This is a problem especially in pregnant women. The urine in the bladder can become filled with bacteria from multiple causes, including: °¨ Inflammation of the prostate gland (prostatitis). °¨ Sexual intercourse in females. °¨ Bladder infection (cystitis). °· Bacteria traveling from the bloodstream to the tissue part of the kidney. °Problems that may increase your risk of getting a kidney infection include: °· Diabetes. °· Kidney stones or bladder stones. °· Cancer. °· Catheters placed in the bladder. °· Other abnormalities of the kidney or ureter. °SYMPTOMS  °· Abdominal pain. °· Pain in the side or flank area. °· Fever. °· Chills. °· Upset stomach. °· Blood in the urine (dark urine). °· Frequent urination. °· Strong or persistent urge to urinate. °· Burning or stinging when urinating. °DIAGNOSIS  °Your caregiver may diagnose your kidney infection based on your symptoms. A urine sample may also be taken. °TREATMENT  °In general, treatment depends on how severe the infection is.  °· If the infection is mild and caught early, your caregiver may treat you with oral antibiotics and send you home. °· If the infection is more severe, the bacteria may have gotten into the bloodstream. This will require intravenous (IV) antibiotics and a hospital stay. Symptoms may include: °¨ High fever. °¨ Severe flank pain. °¨ Shaking chills. °· Even after a hospital stay, your caregiver may require you to be on oral antibiotics for a period of time. °· Other treatments may be required depending upon the cause of the infection. °HOME CARE INSTRUCTIONS  °· Take your  antibiotics as directed. Finish them even if you start to feel better. °· Make an appointment to have your urine checked to make sure the infection is gone. °· Drink enough fluids to keep your urine clear or pale yellow. °· Take medicines for the bladder if you have urgency and frequency of urination as directed by your caregiver. °SEEK IMMEDIATE MEDICAL CARE IF:  °· You have a fever or persistent symptoms for more than 2-3 days. °· You have a fever and your symptoms suddenly get worse. °· You are unable to take your antibiotics or fluids. °· You develop shaking chills. °· You experience extreme weakness or fainting. °· There is no improvement after 2 days of treatment. °MAKE SURE YOU: °· Understand these instructions. °· Will watch your condition. °· Will get help right away if you are not doing well or get worse. °Document Released: 03/26/2005 Document Revised: 09/25/2011 Document Reviewed: 08/30/2010 °Exit[employer]® Patient Information ©2015 [employer]. This information is not intended to replace advice given to you by your health care provider. Make sure you discuss any questions you have with your health care provider. ° °

## 2014-09-30 NOTE — ED Notes (Signed)
Pt c/o right side abdominal pain, cramps, and HA for two weeks. Pt denies dysuria, n/v/d, constipation. LMP 5/29.

## 2014-09-30 NOTE — ED Provider Notes (Signed)
CSN: [id number]     Arrival date & time 09/30/14  0122 History  This chart was scribed for Loren Racer, MD by Bronson Curb, ED Scribe. This patient was seen in room D31C/D31C and the patient's care was started at 3:34 AM.   Chief Complaint  Patient presents with  . Abdominal Pain    The history is provided by the patient. No language interpreter was used.     HPI Comments: Beth Vargas is a 24 y.o. female who presents to the Emergency Department complaining of 8/10, aching, right lateral abdominal pain with radiation to the right flank that has been ongoing for the past 2 weeks. There is associated subjective fever (triage temp 98.7 F), chills, frequency, urgency, and decreased urine output. She denies nausea, vomiting, dysuria, hematuria, or vaginal bleeding/discharge. LNMP 09/05/2014   Past Medical History  Diagnosis Date  . No pertinent past medical history   . Medical history non-contributory   . Herpes genitalia   . Gonorrhea   . Chlamydia   . Migraine    Past Surgical History  Procedure Laterality Date  . No past surgeries     Family History  Problem Relation Age of Onset  . Hypertension Mother    History  Substance Use Topics  . Smoking status: Current Every Day Smoker -- 0.25 packs/day    Types: Cigarettes  . Smokeless tobacco: Never Used  . Alcohol Use: No   OB History    Gravida Para Term Preterm AB TAB SAB Ectopic Multiple Living   0 0 0 0 0 0 1     Review of Systems  Constitutional: Positive for fever, chills and fatigue.  HENT: Negative for congestion and sore throat.   Respiratory: Negative for cough and shortness of breath.   Cardiovascular: Negative for chest pain.  Gastrointestinal: Positive for abdominal pain. Negative for nausea, vomiting and diarrhea.  Genitourinary: Positive for frequency and flank pain. Negative for dysuria, vaginal bleeding, vaginal discharge, difficulty urinating and pelvic pain.  Musculoskeletal: Positive  for back pain. Negative for neck pain and neck stiffness.  Skin: Negative for rash and wound.  Neurological: Negative for dizziness, weakness, light-headedness, numbness and headaches.  All other systems reviewed and are negative.     Allergies  Review of patient's allergies indicates no known allergies.  Home Medications   Prior to Admission medications   Medication Sig Start Date End Date Taking? Authorizing Provider  acetaminophen (TYLENOL) 500 MG tablet Take 500 mg by mouth every 6 (six) hours as needed for mild pain.   Yes Historical Provider, MD  acyclovir (ZOVIRAX) 400 MG tablet Take 1 tablet (400 mg total) by mouth 3 (three) times daily. Patient taking differently: Take 400 mg by mouth 2 (two) times daily as needed (outbreaks).  02/21/14  Yes Tilden Fossa, MD  cephALEXin (KEFLEX) 500 MG capsule Take 1 capsule (500 mg total) by mouth 4 (four) times daily. 09/30/14   Loren Racer, MD  HYDROcodone-acetaminophen (NORCO) 5-325 MG per tablet Take 1 tablet by mouth every 4 (four) hours as needed for severe pain. 09/30/14   Loren Racer, MD  ibuprofen (ADVIL,MOTRIN) 600 MG tablet Take 1 tablet (600 mg total) by mouth every 6 (six) hours as needed. 09/30/14   Loren Racer, MD   Triage Vitals: BP 106/77 mmHg  Pulse 97  Temp(Src) 98.7 F (37.1 C) (Oral)  Resp 16  SpO2 100%  LMP 09/05/2014 (Exact Date)  Physical Exam  Constitutional: She is oriented to person, place,  and time. She appears well-developed and well-nourished. No distress.  HENT:  Head: Normocephalic and atraumatic.  Mouth/Throat: Oropharynx is clear and moist.  Eyes: EOM are normal. Pupils are equal, round, and reactive to light.  Neck: Normal range of motion. Neck supple.  Cardiovascular: Normal rate and regular rhythm.   Pulmonary/Chest: Effort normal and breath sounds normal. No respiratory distress. She has no wheezes. She has no rales. She exhibits no tenderness.  Abdominal: Soft. Bowel sounds are normal.  She exhibits no distension and no mass. There is tenderness (mild tenderness to deep palpation in the right upper quadrant. No rebound or guarding.). There is no rebound and no guarding.  Musculoskeletal: Normal range of motion. She exhibits no edema or tenderness.  Right CVA tenderness to percussion.  Neurological: She is alert and oriented to person, place, and time.  Skin: Skin is warm and dry. No rash noted. No erythema.  Psychiatric: She has a normal mood and affect. Her behavior is normal.  Nursing note and vitals reviewed.   ED Course  Procedures (including critical care time)  DIAGNOSTIC STUDIES: Oxygen Saturation is 100% on room air, normal by my interpretation.    COORDINATION OF CARE: At 0337 Discussed treatment plan with patient which includes ABX. Patient agrees.   Labs Review Labs Reviewed  COMPREHENSIVE METABOLIC PANEL - Abnormal; Notable for the following:    Potassium 3.2 (*)    BUN <5 (*)    Calcium 8.7 (*)    Albumin 3.3 (*)    AST 13 (*)    ALT 7 (*)    All other components within normal limits  CBC WITH DIFFERENTIAL/PLATELET - Abnormal; Notable for the following:    Monocytes Absolute 1.1 (*)    All other components within normal limits  URINALYSIS, ROUTINE W REFLEX MICROSCOPIC (NOT AT [facility]) - Abnormal; Notable for the following:    Hgb urine dipstick TRACE (*)    Ketones, ur 15 (*)    Protein, ur 30 (*)    Nitrite POSITIVE (*)    Leukocytes, UA MODERATE (*)    All other components within normal limits  LIPASE, BLOOD - Abnormal; Notable for the following:    Lipase 16 (*)    All other components within normal limits  URINE MICROSCOPIC-ADD ON - Abnormal; Notable for the following:    Bacteria, UA MANY (*)    All other components within normal limits  PREGNANCY, URINE    Imaging Review No results found.   EKG Interpretation None      MDM   Final diagnoses:  Pyelonephritis    I personally performed the services described in this  documentation, which was scribed in my presence. The recorded information has been reviewed and is accurate.  Patient is well-appearing. Mild tachycardia. Exam and UA consistent with pyelonephritis. Given IV Rocephin in the emergency department. Improvement of tachycardia with IV fluids. Patient is well-appearing. We'll discharge home with PO antibiotics and strict return precautions.   Loren Racer, MD 09/30/14 [phone]

## 2015-01-11 ENCOUNTER — Emergency Department (HOSPITAL_COMMUNITY)
Admission: EM | Admit: 2015-01-11 | Discharge: 2015-01-11 | Disposition: A | Discharge status: Home / Self Care | Payer: Self-pay | Attending: Emergency Medicine | Admitting: Emergency Medicine

## 2015-01-11 ENCOUNTER — Encounter (HOSPITAL_COMMUNITY): Payer: Self-pay | Admitting: Emergency Medicine

## 2015-01-11 DIAGNOSIS — R630 Anorexia: Secondary | ICD-10-CM | POA: Insufficient documentation

## 2015-01-11 DIAGNOSIS — Z8679 Personal history of other diseases of the circulatory system: Secondary | ICD-10-CM | POA: Insufficient documentation

## 2015-01-11 DIAGNOSIS — Z720 Tobacco use: Secondary | ICD-10-CM | POA: Insufficient documentation

## 2015-01-11 DIAGNOSIS — Z8619 Personal history of other infectious and parasitic diseases: Secondary | ICD-10-CM | POA: Insufficient documentation

## 2015-01-11 DIAGNOSIS — R000 Tachycardia, unspecified: Secondary | ICD-10-CM | POA: Insufficient documentation

## 2015-01-11 DIAGNOSIS — Z792 Long term (current) use of antibiotics: Secondary | ICD-10-CM | POA: Insufficient documentation

## 2015-01-11 DIAGNOSIS — Z79899 Other long term (current) drug therapy: Secondary | ICD-10-CM | POA: Insufficient documentation

## 2015-01-11 DIAGNOSIS — J029 Acute pharyngitis, unspecified: Secondary | ICD-10-CM | POA: Insufficient documentation

## 2015-01-11 MED ORDER — IBUPROFEN 400 MG PO TABS
800.0000 mg | ORAL_TABLET | Freq: Once | ORAL | Status: AC
  Administered 2015-01-11: 800 mg via ORAL
  Filled 2015-01-11: qty 2

## 2015-01-11 MED ORDER — IBUPROFEN 600 MG PO TABS: 600.0000 mg | ORAL_TABLET | Freq: Four times a day (QID) | ORAL | Status: AC | PRN

## 2015-01-11 NOTE — Discharge Instructions (Signed)
You were evaluated in the ED today for your sore throat. There does not appear to be an emergent cause her symptoms at this time. It is important to stay well-hydrated at home drinking plenty of water or Gatorade. You may also drink warm fluids like Tea or coffee to help with your throat discomfort. It is also important for you to use salt water gargles. Your symptoms will likely last for 7-10 days. Return to ED for any worsening symptoms.  Pharyngitis Pharyngitis is a sore throat (pharynx). There is redness, pain, and swelling of your throat. HOME CARE   Drink enough fluids to keep your pee (urine) clear or pale yellow.  Only take medicine as told by your doctor.  You may get sick again if you do not take medicine as told. Finish your medicines, even if you start to feel better.  Do not take aspirin.  Rest.  Rinse your mouth (gargle) with salt water ( tsp of salt per 1 qt of water) every 1-2 hours. This will help the pain.  If you are not at risk for choking, you can suck on hard candy or sore throat lozenges. GET HELP IF:  You have large, tender lumps on your neck.  You have a rash.  You cough up green, yellow-brown, or bloody spit. GET HELP RIGHT AWAY IF:   You have a stiff neck.  You drool or cannot swallow liquids.  You throw up (vomit) or are not able to keep medicine or liquids down.  You have very bad pain that does not go away with medicine.  You have problems breathing (not from a stuffy nose). MAKE SURE YOU:   Understand these instructions.  Will watch your condition.  Will get help right away if you are not doing well or get worse. Document Released: 09/12/2007 Document Revised: 01/14/2013 Document Reviewed: 12/01/2012 [facility] Patient Information 2015 [place], [place]. This information is not intended to replace advice given to you by your health care provider. Make sure you discuss any questions you have with your health care provider.  Salt Water  Gargle This solution will help make your mouth and throat feel better. HOME CARE INSTRUCTIONS   Mix 1 teaspoon of salt in 8 ounces of warm water.  Gargle with this solution as much or often as you need or as directed. Swish and gargle gently if you have any sores or wounds in your mouth.  Do not swallow this mixture. Document Released: 12/29/2003 Document Revised: 06/18/2011 Document Reviewed: 05/21/2008 [facility] Patient Information 2015 [place], [place]. This information is not intended to replace advice given to you by your health care provider. Make sure you discuss any questions you have with your health care provider.

## 2015-01-11 NOTE — ED Provider Notes (Signed)
CSN: [id number]     Arrival date & time 01/11/15  0908 History  By signing my name below, I, Freida Busman, attest that this documentation has been prepared under the direction and in the presence of non-physician practitioner, Joycie Peek, PA-C. Electronically Signed: Freida Busman, Scribe. 01/11/2015. 9:51 AM.      Chief Complaint  Patient presents with  . Sore Throat  . Cough   The history is provided by the patient. No language interpreter was used.     HPI Comments:  Beth Vargas is a 24 y.o. female who presents to the Emergency Department complaining of a sore throat for 3 days. She reports 6/10 pain at this time. She also reports associated dry cough. Pt denies fever, and voice change. She also denies recent sick contacts. Reports a decreased fluid intake secondary to sore throat. No alleviating factors noted. Denies any other symptoms. No other aggravating or modifying factors.  Past Medical History  Diagnosis Date  . No pertinent past medical history   . Medical history non-contributory   . Herpes genitalia   . Gonorrhea   . Chlamydia   . Migraine    Past Surgical History  Procedure Laterality Date  . No past surgeries     Family History  Problem Relation Age of Onset  . Hypertension Mother    Social History  Substance Use Topics  . Smoking status: Current Every Day Smoker -- 0.25 packs/day    Types: Cigarettes  . Smokeless tobacco: Never Used  . Alcohol Use: Yes   OB History    Gravida Para Term Preterm AB TAB SAB Ectopic Multiple Living   0 0 0 0 0 0 1     Review of Systems  Constitutional: Negative for fever.  HENT: Positive for sore throat. Negative for voice change.   Respiratory: Positive for cough.   All other systems reviewed and are negative.  Allergies  Review of patient's allergies indicates no known allergies.  Home Medications   Prior to Admission medications   Medication Sig Start Date End Date Taking? Authorizing Provider   acetaminophen (TYLENOL) 500 MG tablet Take 500 mg by mouth every 6 (six) hours as needed for mild pain.    Historical Provider, MD  acyclovir (ZOVIRAX) 400 MG tablet Take 1 tablet (400 mg total) by mouth 3 (three) times daily. Patient taking differently: Take 400 mg by mouth 2 (two) times daily as needed (outbreaks).  02/21/14   Tilden Fossa, MD  cephALEXin (KEFLEX) 500 MG capsule Take 1 capsule (500 mg total) by mouth 4 (four) times daily. 09/30/14   Loren Racer, MD  HYDROcodone-acetaminophen (NORCO) 5-325 MG per tablet Take 1 tablet by mouth every 4 (four) hours as needed for severe pain. 09/30/14   Loren Racer, MD  ibuprofen (ADVIL,MOTRIN) 600 MG tablet Take 1 tablet (600 mg total) by mouth every 6 (six) hours as needed. 01/11/15   Sharda Keddy, PA-C   BP 122/65 mmHg  Pulse 111  Temp(Src) 98.8 F (37.1 C) (Oral)  Resp 18  Ht  (1.651 m)  Wt 110 lb 1.6 oz (49.941 kg)  BMI 18.32 kg/m2  SpO2 100%  LMP 12/25/2014 Physical Exam  Constitutional: She is oriented to person, place, and time. She appears well-developed and well-nourished. No distress.  HENT:  Head: Normocephalic and atraumatic.  No unilateral tonsilar swelling  Uvula midline No exudate Mild posteriororophargeal erythema No trismus Tolerating secretions well with patent airway  Eyes: Conjunctivae are normal.  Neck:  Mild left sided cervical adenopathy  Cardiovascular: Regular rhythm and normal heart sounds.   Mild tachycardia  Pulmonary/Chest: Effort normal.      Abdominal: Soft. She exhibits no distension. There is no hepatosplenomegaly, splenomegaly or hepatomegaly. There is no tenderness.  Lymphadenopathy:    She has cervical adenopathy.  Neurological: She is alert and oriented to person, place, and time.  Skin: Skin is warm and dry.  Psychiatric: She has a normal mood and affect.  Nursing note and vitals reviewed.   ED Course  Procedures   DIAGNOSTIC STUDIES:  Oxygen Saturation is 100%  on RA, normal by my interpretation.    COORDINATION OF CARE:  9:37 AM Will discharge with Rx for Ibuprofen. Advised  pt to keep hydrated, and to use salt water gargles and drinking warm beverages for her throat discomfort.. Discussed treatment plan with pt at bedside and pt agreed to plan.  Labs Review Labs Reviewed - No data to display  Imaging Review No results found. I have personally reviewed and evaluated these images and lab results as part of my medical decision-making.   EKG Interpretation None     Meds given in ED:  Medications  ibuprofen (ADVIL,MOTRIN) tablet 800 mg (not administered)    New Prescriptions   IBUPROFEN (ADVIL,MOTRIN) 600 MG TABLET    Take 1 tablet (600 mg total) by mouth every 6 (six) hours as needed.   Filed Vitals:   01/11/15 0914  BP: 122/65  Pulse: 111  Temp: 98.8 F (37.1 C)  TempSrc: Oral  Resp: 18  Height:  (1.651 m)  Weight: 110 lb 1.6 oz (49.941 kg)  SpO2: 100%    MDM  Vitals stable, slight tachycardia -afebrile Pt resting comfortably in ED. PE--physical exam as above. No unilateral tonsillar swelling, trismus. Neck is supple. Patent airway and tolerating secretions well. No evidence of Ludwig angina, retropharyngeal or peritonsillar abscess. Per [facility] criteria, patient is very low risk for strep pharyngitis. Symptoms most consistent with viral pharyngitis. We'll treat symptomatically. DC with Motrin, instructions were salt water gargle. Suspect tachycardia is multifactorial and due to virus with concomitant mild dehydration.  I discussed all relevant lab findings and imaging results with pt and they verbalized understanding. Discussed f/u with PCP within 48 hrs and return precautions, pt very amenable to plan.  Final diagnoses:  Pharyngitis   I personally performed the services described in this documentation, which was scribed in my presence. The recorded information has been reviewed and is accurate.       Joycie Peek, PA-C 01/11/15 6213  Laurence Spates, MD 01/11/15 1017

## 2015-01-11 NOTE — ED Notes (Signed)
C/o sore throat and cough x 3 days. Non-productive, worse at night. UNABLE TO OBTAIN STREP SCREEN. Pt would not tolerate.

## 2015-03-13 ENCOUNTER — Encounter (HOSPITAL_COMMUNITY): Payer: Self-pay | Admitting: *Deleted

## 2015-03-13 ENCOUNTER — Emergency Department (INDEPENDENT_AMBULATORY_CARE_PROVIDER_SITE_OTHER)
Admission: EM | Admit: 2015-03-13 | Discharge: 2015-03-13 | Disposition: A | Discharge status: Home / Self Care | Payer: Self-pay | Source: Home / Self Care | Attending: Emergency Medicine | Admitting: Emergency Medicine

## 2015-03-13 DIAGNOSIS — J0120 Acute ethmoidal sinusitis, unspecified: Secondary | ICD-10-CM

## 2015-03-13 LAB — POCT RAPID STREP A: STREPTOCOCCUS, GROUP A SCREEN (DIRECT): NEGATIVE

## 2015-03-13 MED ORDER — FLUTICASONE PROPIONATE 50 MCG/ACT NA SUSP: 2.0000 | Freq: Every day | NASAL | Status: AC

## 2015-03-13 MED ORDER — AZITHROMYCIN 250 MG PO TABS: ORAL_TABLET | ORAL | Status: AC

## 2015-03-13 NOTE — ED Notes (Signed)
Pt reports    Symptoms  Of  sorethroat      Cough   And  Congestion          X  sev  Weeks  Reports  Symptoms        pt  Is  Hoarse  As well

## 2015-03-13 NOTE — Discharge Instructions (Signed)
You have a sinus infection. Take azithromycin as prescribed. Use Flonase daily for the next week. Please get nasal saline spray at the drug store and use this as often as you can. You should see improvement in the next 2-3 days. Follow-up as needed.

## 2015-03-13 NOTE — ED Provider Notes (Signed)
CSN: [id number]     Arrival date & time 03/13/15  1549 History   First MD Initiated Contact with Patient 03/13/15 1634     Chief Complaint  Patient presents with  . Sore Throat   (Consider location/radiation/quality/duration/timing/severity/associated sxs/prior Treatment) HPI  She is a 24 year old woman here for evaluation of sore throat. She states she's had cold symptoms for the last 2 weeks. She describes nasal congestion, rhinorrhea, sore throat, cough. She reports fevers at first, but these have resolved. No nausea or vomiting. No shortness of breath. She describes headache over the last several days. It is typically located around the right eye.  She is also had a hoarse voice for the last week.   Past Medical History  Diagnosis Date  . No pertinent past medical history   . Medical history non-contributory   . Herpes genitalia   . Gonorrhea   . Chlamydia   . Migraine    Past Surgical History  Procedure Laterality Date  . No past surgeries     Family History  Problem Relation Age of Onset  . Hypertension Mother    Social History  Substance Use Topics  . Smoking status: Current Every Day Smoker -- 0.25 packs/day    Types: Cigarettes  . Smokeless tobacco: Never Used  . Alcohol Use: Yes   OB History    Gravida Para Term Preterm AB TAB SAB Ectopic Multiple Living   0 0 0 0 0 0 1     Review of Systems As in history of present illness Allergies  Review of patient's allergies indicates no known allergies.  Home Medications   Prior to Admission medications   Medication Sig Start Date End Date Taking? Authorizing Provider  acetaminophen (TYLENOL) 500 MG tablet Take 500 mg by mouth every 6 (six) hours as needed for mild pain.    Historical Provider, MD  acyclovir (ZOVIRAX) 400 MG tablet Take 1 tablet (400 mg total) by mouth 3 (three) times daily. Patient taking differently: Take 400 mg by mouth 2 (two) times daily as needed (outbreaks).  02/21/14   Tilden Fossa,  MD  azithromycin (ZITHROMAX Z-PAK) 250 MG tablet Take 2 pills today, then 1 pill daily until gone. 03/13/15   Charm Rings, MD  fluticasone (FLONASE) 50 MCG/ACT nasal spray Place 2 sprays into both nostrils daily. 03/13/15   Charm Rings, MD  ibuprofen (ADVIL,MOTRIN) 600 MG tablet Take 1 tablet (600 mg total) by mouth every 6 (six) hours as needed. 01/11/15   Joycie Peek, PA-C   Meds Ordered and Administered this Visit  Medications - No data to display  BP 107/75 mmHg  Pulse 112  Temp(Src) 98.2 F (36.8 C) (Oral)  SpO2 98%  LMP 03/13/2015 (Exact Date) No data found.   Physical Exam  Constitutional: She is oriented to person, place, and time. She appears well-developed and well-nourished. No distress.  HENT:  Mouth/Throat: No oropharyngeal exudate.  Nasal mucosa is erythematous and boggy. She has quite a bit of postnasal drainage.  Neck: Neck supple.  Cardiovascular: Regular rhythm and normal heart sounds.   No murmur heard. Mild tachycardia  Pulmonary/Chest: Effort normal and breath sounds normal. No respiratory distress. She has no wheezes. She has no rales.  Lymphadenopathy:    She has cervical adenopathy (left anterior chain).  Neurological: She is alert and oriented to person, place, and time.    ED Course  Procedures (including critical care time)  Labs Review Labs Reviewed  POCT RAPID  STREP A    Imaging Review No results found.    MDM   1. Acute ethmoidal sinusitis, recurrence not specified    We'll treat with azithromycin and Flonase. Recommended frequent use of nasal saline spray. Follow-up as needed.    Charm RingsErin J Llana Deshazo, MD 03/13/15 [phone]

## 2015-03-15 LAB — CULTURE, GROUP A STREP: Strep A Culture: NEGATIVE

## 2015-03-15 NOTE — ED Notes (Signed)
Final report of strep testing negative
